# Patient Record
Sex: Male | Born: 2000 | Race: White | Hispanic: No | Marital: Single | Smoking: Never smoker
Health system: Southern US, Community
[De-identification: ages and names within clinical notes are randomized; demographics above are authoritative.]

## PROBLEM LIST (undated history)

## (undated) DIAGNOSIS — F419 Anxiety disorder, unspecified: Secondary | ICD-10-CM

## (undated) DIAGNOSIS — F329 Major depressive disorder, single episode, unspecified: Secondary | ICD-10-CM

## (undated) DIAGNOSIS — F32A Depression, unspecified: Secondary | ICD-10-CM

---
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
## 2021-05-05 ENCOUNTER — Emergency Department: Admit: 2021-05-05 | Payer: BLUE CROSS/BLUE SHIELD

## 2021-05-05 ENCOUNTER — Inpatient Hospital Stay
Admit: 2021-05-05 | Discharge: 2021-05-05 | Disposition: A | Discharge status: Home / Self Care | Payer: MEDICAID | Attending: Emergency Medicine

## 2021-05-05 DIAGNOSIS — H5462 Unqualified visual loss, left eye, normal vision right eye: Secondary | ICD-10-CM

## 2021-05-05 LAB — CBC WITH AUTO DIFFERENTIAL
Absolute Immature Granulocyte: 0 10*3/uL (ref 0.0–0.5)
Basophils %: 0 % (ref 0.0–2.0)
Basophils Absolute: 0 10*3/uL (ref 0.0–0.2)
Eosinophils %: 1 % (ref 0.5–7.8)
Eosinophils Absolute: 0 10*3/uL (ref 0.0–0.8)
Hematocrit: 46.4 % (ref 41.1–50.3)
Hemoglobin: 15.8 g/dL (ref 13.6–17.2)
Immature Granulocytes: 0 % (ref 0.0–5.0)
Lymphocytes %: 29 % (ref 13–44)
Lymphocytes Absolute: 1.7 10*3/uL (ref 0.5–4.6)
MCH: 29.2 PG (ref 26.1–32.9)
MCHC: 34.1 g/dL (ref 31.4–35.0)
MCV: 85.6 FL (ref 82–102)
MPV: 10.6 FL (ref 9.4–12.3)
Monocytes %: 7 % (ref 4.0–12.0)
Monocytes Absolute: 0.4 10*3/uL (ref 0.1–1.3)
Neutrophils %: 63 % (ref 43–78)
Neutrophils Absolute: 3.7 10*3/uL (ref 1.7–8.2)
Platelets: 242 10*3/uL (ref 150–450)
RBC: 5.42 M/uL (ref 4.23–5.6)
RDW: 13.6 % (ref 11.9–14.6)
WBC: 5.9 10*3/uL (ref 4.3–11.1)
nRBC: 0 10*3/uL (ref 0.0–0.2)

## 2021-05-05 LAB — COMPREHENSIVE METABOLIC PANEL
ALT: 54 U/L (ref 12–65)
AST: 24 U/L (ref 15–37)
Albumin: 4.5 g/dL (ref 3.5–5.0)
Alk Phosphatase: 51 U/L (ref 50–136)
Anion Gap: 6 mmol/L (ref 2–11)
BUN: 11 MG/DL (ref 6–23)
CO2: 27 mmol/L (ref 21–32)
Calcium: 9.8 MG/DL (ref 8.3–10.4)
Chloride: 108 mmol/L (ref 101–110)
Creatinine: 0.9 MG/DL (ref 0.8–1.5)
Est, Glom Filt Rate: >60 mL/min/{1.73_m2} (ref >60)
Glucose: 94 mg/dL (ref 65–100)
Potassium: 3.1 mmol/L — ABNORMAL LOW (ref 3.5–5.1)
Sodium: 141 mmol/L (ref 133–143)
Total Bilirubin: 0.9 MG/DL (ref 0.2–1.1)
Total Protein: 8.4 g/dL — ABNORMAL HIGH (ref 6.3–8.2)

## 2021-05-05 NOTE — ED Triage Notes (Signed)
Pt ambulatory to triage with fiance with CO "I think I had a seizure this morning, I was watching tiktok and just blanked out and feel really drained and shaky and I cant see out of ly left eye" reports similar vision changes with seizures in past. Not currently taking medications. Last seizure approximately 5 months ago.

## 2021-05-05 NOTE — ED Provider Notes (Signed)
Emergency Department Provider Note                   PCP:                None None               Age: 21 y.o.      Sex: male     DISPOSITION Decision To Discharge 05/05/2021 11:21:57 AM       ICD-10-CM    1. Vision loss of left eye  H54.62       2. Seizure (Cash)  R56.9           MEDICAL DECISION MAKING  Complexity of Problems Addressed:  1 or more acute illnesses that pose a threat to life or bodily function.     Data Reviewed and Analyzed:  Category 1:   I reviewed records from an external source: provider visit notes from outside specialist.  I ordered each unique test.  I reviewed the results of each unique test.        Category 2:       I independently ordered and reviewed the CT Scan.  No acute    Category 3: Discussion of management or test interpretation.  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  Had questionable seizure-like activity this morning and has blurry vision since then.  Denies any pain.  No headache.  With vision decrease contacted [facility] eye and they request patient follow-up in the office immediately after discharge from here.  Will have patient follow-up with his neurologist for possible recurrent seizures.  Will discharge.         Risk of Complications and/or Morbidity of Patient Management:  Patient was discharged risks and benefits of hospitalization were considered and Discussion with external consultants     Is this patient to be included in the SEP-1 core measure due to severe sepsis or septic shock? No Exclusion criteria - the patient is NOT to be included for SEP-1 Core Measure due to: Infection is not suspected     Carl Carter is a 21 y.o. 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  Has had 2 previous episodes of blurry vision in that and sees a retina specialist at [facility] eye.  Dr. Michail Jewels.  Also has a  history of seizures and is followed by neurologist in [place].  Was on Keppra but had some side effects per him so stopped.  Last seizure was 5 months ago.  He was lying in bed this morning looking at his phone.  The next thing he knows his phone is on the ground and he has some blurry vision in his left eye.  Afterwards was shaking due to anxiety about possibly having a seizure.  Comes in for evaluation.  Left eye is still blurry.  Denies any eye pain or headache.  No chest pain or shortness of breath.    The history is provided by the patient. No language interpreter was used.   Seizures  Seizure activity on arrival: no    Seizure type:  Unable to specify  Episode characteristics: disorientation    Episode characteristics: no incontinence and no tongue biting    Return to baseline: yes    Severity:  Mild  Timing:  Once  Progression:  Resolved  Context: medical non-compliance    Recent head injury:  No recent head injuries     Review of Systems   Constitutional:  Negative for chills and fever.   HENT:  Negative for rhinorrhea and sore throat.    Eyes:  Positive for visual disturbance. Negative for pain and redness.   Respiratory:  Negative for cough and shortness of breath.    Cardiovascular:  Negative for chest pain and palpitations.   Gastrointestinal:  Negative for abdominal pain, constipation, diarrhea, nausea and vomiting.   Genitourinary:  Negative for dysuria and hematuria.   Musculoskeletal:  Negative for back pain and neck pain.   Skin:  Negative for color change and rash.   Neurological:  Positive for seizures. Negative for facial asymmetry, speech difficulty, weakness, light-headedness, numbness and headaches.   All other systems reviewed and are negative.    Vitals signs and nursing note reviewed:  Patient Vitals for the past 4 hrs:   Temp Pulse Resp BP SpO2   05/05/21 1015 -- 77 -- 129/79 98 %   05/05/21 0917 98.1 ??F (36.7 ??C) (!) 102 20 (!) 148/85 100 %          Physical Exam  Vitals and nursing note  reviewed.   Constitutional:       Appearance: Normal appearance.   HENT:      Head: Normocephalic and atraumatic.   Eyes:      General: Lids are normal.      Extraocular Movements: Extraocular movements intact.      Conjunctiva/sclera: Conjunctivae normal.      Pupils: Pupils are equal, round, and reactive to light.      Left eye: Pupil is not sluggish.      Comments: Right eye 20/20  Left eye can't see the letters on the chart. Vision is not dark just blurry.    Cardiovascular:      Rate and Rhythm: Normal rate and regular rhythm.   Pulmonary:      Effort: Pulmonary effort is normal.      Breath sounds: Normal breath sounds. No wheezing.   Abdominal:      General: Bowel sounds are normal.      Palpations: Abdomen is soft.      Tenderness: There is no abdominal tenderness.   Musculoskeletal:         General: No swelling. Normal range of motion.      Cervical back: Normal range of motion. No tenderness.   Skin:     General: Skin is warm and dry.   Neurological:      General: No focal deficit present.      Mental Status: He is alert and oriented to person, place, and time.      Cranial Nerves: No cranial nerve deficit.      Motor: No weakness.        Procedures          Orders Placed This Encounter   Procedures    CT HEAD WO CONTRAST    CBC with Auto Differential    Comprehensive Metabolic Panel    Visual acuity screening        Medications - No data to display    New Prescriptions    No medications on file        History reviewed. No pertinent past medical history.     History reviewed. No pertinent surgical history.     History reviewed. No pertinent family history.  Social History     Socioeconomic History    Marital status: Single     Spouse name: None    Number of children: None    Years of education: None    Highest education level: None        Allergies: Patient has no known allergies.    Previous Medications    No medications on file        Results for orders placed or performed during the hospital  encounter of 05/05/21   CT HEAD WO CONTRAST    Narrative    CT BRAIN WITHOUT CONTRAST  05/05/2021 10:40 AM    INDICATION: Headache, seizure history.    COMPARISON: None available at this hospital PACS system    TECHNIQUE:  Multiple contiguous axial images are obtained encompassing the brain  from the skull base to the vertex.  For this CT scanner at least one of the  following techniques is utilized to decrease patient radiation exposure:  Automatic exposure control, modulation of MA and KVP based on patient weight,  and iterative reconstruction.    FINDINGS: Ventricles are midline and of appropriate size and configuration for  age.   The midline and parasellar structures, and posterior fossa are intact.    There is no finding of an acute cortical stroke, mass lesion, or acute  intracranial blood.    No extra-axial fluid collection.  No other focal finding.    The skull is intact, no significant abnormality.  Paranasal sinuses clear.   Orbits, paranasal sinuses not remarkable.      Impression    NO ACUTE INTRACRANIAL ABNORMALITY.       CBC with Auto Differential   Result Value Ref Range    WBC 5.9 4.3 - 11.1 K/uL    RBC 5.42 4.23 - 5.6 M/uL    Hemoglobin 15.8 13.6 - 17.2 g/dL    Hematocrit 46.4 41.1 - 50.3 %    MCV 85.6 82 - 102 FL    MCH 29.2 26.1 - 32.9 PG    MCHC 34.1 31.4 - 35.0 g/dL    RDW 13.6 11.9 - 14.6 %    Platelets 242 150 - 450 K/uL    MPV 10.6 9.4 - 12.3 FL    nRBC 0.00 0.0 - 0.2 K/uL    Differential Type AUTOMATED      Seg Neutrophils 63 43 - 78 %    Lymphocytes 29 13 - 44 %    Monocytes 7 4.0 - 12.0 %    Eosinophils % 1 0.5 - 7.8 %    Basophils 0 0.0 - 2.0 %    Immature Granulocytes 0 0.0 - 5.0 %    Segs Absolute 3.7 1.7 - 8.2 K/UL    Absolute Lymph # 1.7 0.5 - 4.6 K/UL    Absolute Mono # 0.4 0.1 - 1.3 K/UL    Absolute Eos # 0.0 0.0 - 0.8 K/UL    Basophils Absolute 0.0 0.0 - 0.2 K/UL    Absolute Immature Granulocyte 0.0 0.0 - 0.5 K/UL   Comprehensive Metabolic Panel   Result Value Ref Range    Sodium  141 133 - 143 mmol/L    Potassium 3.1 (L) 3.5 - 5.1 mmol/L    Chloride 108 101 - 110 mmol/L    CO2 27 21 - 32 mmol/L    Anion Gap 6 2 - 11 mmol/L    Glucose 94 65 - 100 mg/dL    BUN 11 6 - 23 MG/DL  Creatinine PENDING MG/DL    Est, Glom Filt Rate PENDING ml/min/1.70m    Calcium 9.8 8.3 - 10.4 MG/DL    Total Bilirubin PENDING MG/DL    ALT PENDING U/L    AST PENDING U/L    Alk Phosphatase PENDING U/L    Total Protein PENDING g/dL    Albumin 4.5 3.5 - 5.0 g/dL    Globulin Cannot be calculated 2.8 - 4.5 g/dL    Albumin/Globulin Ratio PENDING          CT HEAD WO CONTRAST   Final Result   NO ACUTE INTRACRANIAL ABNORMALITY.                              Voice dictation software was used during the making of this note.  This software is not perfect and grammatical and other typographical errors may be present.  This note has not been completely proofread for errors.     JTowanda OctaveIII, MD  05/05/21 [phone]

## 2021-05-05 NOTE — ED Notes (Signed)
I have reviewed discharge instructions with the patient.  The patient verbalized understanding.    Patient left ED via Discharge Method: ambulatory to Home with (Fiance).    Opportunity for questions and clarification provided.       Patient given 0 scripts.         To continue your aftercare when you leave the hospital, you may receive an automated call from our care team to check in on how you are doing.  This is a free service and part of our promise to provide the best care and service to meet your aftercare needs.??? If you have questions, or wish to unsubscribe from this service please call [phone].  Thank you for Choosing our [facility] Emergency Department.      Gevena Barre, RN  05/05/21 1146

## 2022-06-15 ENCOUNTER — Inpatient Hospital Stay
Admit: 2022-06-15 | Discharge: 2022-06-16 | Disposition: A | Discharge status: Home / Self Care | Attending: Emergency Medicine

## 2022-06-15 ENCOUNTER — Emergency Department: Admit: 2022-06-15

## 2022-06-15 DIAGNOSIS — R0789 Other chest pain: Secondary | ICD-10-CM

## 2022-06-15 DIAGNOSIS — R079 Chest pain, unspecified: Secondary | ICD-10-CM

## 2022-06-15 LAB — CBC WITH AUTO DIFFERENTIAL
Basophils %: 0 % (ref 0.0–2.0)
Basophils Absolute: 0 10*3/uL (ref 0.0–0.2)
Eosinophils %: 0 % — ABNORMAL LOW (ref 0.5–7.8)
Eosinophils Absolute: 0 10*3/uL (ref 0.0–0.8)
Hematocrit: 44.8 % (ref 41.1–50.3)
Hemoglobin: 15.8 g/dL (ref 13.6–17.2)
Immature Granulocytes %: 0 % (ref 0.0–5.0)
Immature Granulocytes Absolute: 0 10*3/uL (ref 0.0–0.5)
Lymphocytes %: 16 % (ref 13–44)
Lymphocytes Absolute: 1.9 10*3/uL (ref 0.5–4.6)
MCH: 29.8 PG (ref 26.1–32.9)
MCHC: 35.3 g/dL — ABNORMAL HIGH (ref 31.4–35.0)
MCV: 84.5 FL (ref 82–102)
MPV: 10.6 FL (ref 9.4–12.3)
Monocytes %: 4 % (ref 4.0–12.0)
Monocytes Absolute: 0.5 10*3/uL (ref 0.1–1.3)
Neutrophils %: 80 % — ABNORMAL HIGH (ref 43–78)
Neutrophils Absolute: 9.5 10*3/uL — ABNORMAL HIGH (ref 1.7–8.2)
Platelets: 262 10*3/uL (ref 150–450)
RBC: 5.3 M/uL (ref 4.23–5.6)
RDW: 12.3 % (ref 11.9–14.6)
WBC: 12 10*3/uL — ABNORMAL HIGH (ref 4.3–11.1)
nRBC: 0 10*3/uL (ref 0.0–0.2)

## 2022-06-15 LAB — POCT GLUCOSE: POC Glucose: 111 mg/dL — ABNORMAL HIGH (ref 65–100)

## 2022-06-15 LAB — COMPREHENSIVE METABOLIC PANEL
ALT: 31 U/L (ref 12–65)
AST: 24 U/L (ref 15–37)
Albumin/Globulin Ratio: 1.7 (ref 1.0–1.9)
Albumin: 5 g/dL (ref 3.5–5.0)
Alk Phosphatase: 58 U/L (ref 40–129)
Anion Gap: 15 mmol/L (ref 9–18)
BUN: 11 MG/DL (ref 6–23)
CO2: 23 mmol/L (ref 20–28)
Calcium: 10.2 MG/DL (ref 8.8–10.2)
Chloride: 105 mmol/L (ref 98–107)
Creatinine: 0.91 MG/DL (ref 0.80–1.30)
Est, Glom Filt Rate: >90 mL/min/{1.73_m2} (ref >60)
Globulin: 2.9 g/dL (ref 2.3–3.5)
Glucose: 104 mg/dL — ABNORMAL HIGH (ref 70–99)
Potassium: 2.9 mmol/L — ABNORMAL LOW (ref 3.5–5.1)
Sodium: 142 mmol/L (ref 136–145)
Total Bilirubin: 0.8 MG/DL (ref 0.0–1.2)
Total Protein: 7.9 g/dL (ref 6.3–8.2)

## 2022-06-15 LAB — D-DIMER, QUANTITATIVE: D-Dimer, Quant: 0.31 ug/ml(FEU) (ref <0.56)

## 2022-06-15 LAB — TROPONIN: Troponin T: 7 ng/L (ref 0–22)

## 2022-06-15 MED ORDER — SODIUM CHLORIDE (PF) 0.9 % IJ SOLN
0.9 | Freq: Once | INTRAMUSCULAR | Status: AC
Start: 2022-06-15 — End: 2022-06-15
  Administered 2022-06-15: 23:00:00 1 mg via INTRAVENOUS

## 2022-06-15 MED FILL — LORAZEPAM 2 MG/ML IJ SOLN: 2 MG/ML | INTRAMUSCULAR | Qty: 1

## 2022-06-15 NOTE — ED Notes (Signed)
Patient mobility status  with no difficulty. Provider aware     I have reviewed discharge instructions with the patient.  The patient verbalized understanding.    Patient left ED via Discharge Method: ambulatory to Home with Spouse.    Opportunity for questions and clarification provided.     Patient given 0 scripts.           Betsey Holiday, RN  06/15/22 2148

## 2022-06-15 NOTE — ED Notes (Signed)
Another RN unable to obtain line and labs. Another nurse to obtain ultrasound guided IV at this time. Unable to give IV ativan due to no IV     Betsey Holiday, RN  06/15/22 1822

## 2022-06-15 NOTE — ED Triage Notes (Signed)
Pt [place] via EMS from restaurant. Pt had sudden onset of CP. CP radiates to lt arm, nausea and dizziness. Hx htn. 324 ASA nad 3 nitro given en route. BP 180/110. HR 80s. Family hx MI.

## 2022-06-15 NOTE — ED Provider Notes (Signed)
Emergency Department Provider Note       PCP: None, None (Inactive)   Age: 22 y.o.   Sex: male     DISPOSITION Decision To Discharge 06/15/2022 09:36:17 PM       ICD-10-CM    1. Chest pain, unspecified type  R07.9           Medical Decision Making     Patient seems anxious so he was given Ativan.  I suspect this is the cause of his chest pain.  His workup is unremarkable including a negative D-dimer.  Discussed the findings with him.  Encouraged him to follow-up with his neurologist to reinitiate seizure medication and to avoid driving until he is back on his medication.     1 or more acute illnesses that pose a threat to life or bodily function.   Prescription drug management performed.  Parental controlled substances given in the ED.    I independently ordered and reviewed each unique test.  I reviewed external records: provider visit note from outside specialist. neuro  The patients assessment required an independent historian: S.O..  The reason they were needed is important historical information not provided by the patient.  I interpreted the X-rays no infiltrate.  My Independent EKG Interpretation: sinus rhythm, no evidence of arrhythmia      ST Segments:Normal ST segments - NO STEMI   Rate: 99            History     Presents with chest pain.  He reports stabbing left-sided chest pain for approximately an hour.  He reports nausea and the sensation of spinning and shortness of breath.  He states this occurred while he was on his way to work.  He has never had this before.  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  He has not followed up with his primary care doctor to adjust his medications.  He uses tobacco dip.  He denies alcohol abuse.  He denies being anxious but appears very anxious.  Nursing reported that he had a brief episode in which she would not respond to them.  Patient was awake and alert when I came into the  room after they came to get me for this episode.  Significant other than patient reported likely one of his absence seizure's    The history is provided by the patient.     Physical Exam     Vitals signs and nursing note reviewed:  Vitals:    06/15/22 2020 06/15/22 2030 06/15/22 2045 06/15/22 2115   BP:  128/85 128/87 95/82   Pulse: 93 78 84 96   Resp: 18 16 17 17    Temp:       TempSrc:       SpO2: 100% 100% 100% 99%   Weight:       Height:          Physical Exam  Vitals and nursing note reviewed.   Constitutional:       General: He is not in acute distress.     Appearance: Normal appearance. He is well-developed. He is not ill-appearing.   HENT:      Head: Normocephalic and atraumatic.   Eyes:      Conjunctiva/sclera: Conjunctivae normal.   Cardiovascular:      Rate and Rhythm: Normal rate and regular rhythm.   Pulmonary:      Effort: Pulmonary effort is normal.  Breath sounds: Normal breath sounds.   Abdominal:      Palpations: Abdomen is soft.      Tenderness: There is no abdominal tenderness. There is no guarding or rebound.   Musculoskeletal:         General: Normal range of motion.      Cervical back: Normal range of motion and neck supple.      Right lower leg: No edema.      Left lower leg: No edema.   Skin:     General: Skin is warm and dry.      Coloration: Skin is not jaundiced.   Neurological:      General: No focal deficit present.      Mental Status: He is alert and oriented to person, place, and time.      Cranial Nerves: No cranial nerve deficit.   Psychiatric:         Mood and Affect: Mood normal.         Behavior: Behavior normal.        Procedures     Procedures    Orders Placed This Encounter   Procedures    XR CHEST PORTABLE    CBC with Auto Differential    D-Dimer, Quantitative    Comprehensive Metabolic Panel    Troponin Now and Q 90 Min    Troponin    POCT Glucose    EKG 12 Lead        Medications given during this emergency department visit:  Medications   LORazepam (ATIVAN) 1 mg in  sodium chloride (PF) 0.9 % 10 mL injection (1 mg IntraVENous Given 06/15/22 1831)   potassium bicarb-citric acid (EFFER-K) effervescent tablet 40 mEq (40 mEq Oral Given 06/15/22 2017)       There are no discharge medications for this patient.       No past medical history on file.     No past surgical history on file.     Social History     Socioeconomic History    Marital status: Single   Tobacco Use    Smoking status: Never    Smokeless tobacco: Current     Types: Chew   Substance and Sexual Activity    Drug use: Yes     Types: Marijuana (Weed)     Comment: "every once in awhile"        There are no discharge medications for this patient.       Results for orders placed or performed during the hospital encounter of 06/15/22   XR CHEST PORTABLE    Narrative    Chest X-ray    INDICATION: Chest pain    COMPARISON:  None    TECHNIQUE: AP/PA view of the chest was obtained.    FINDINGS: The lungs are clear. There are no infiltrates or effusions.  The heart  size is normal.  The bony thorax is intact.        Impression    No acute findings in the chest     CBC with Auto Differential   Result Value Ref Range    WBC 12.0 (H) 4.3 - 11.1 K/uL    RBC 5.30 4.23 - 5.6 M/uL    Hemoglobin 15.8 13.6 - 17.2 g/dL    Hematocrit 45.4 09.8 - 50.3 %    MCV 84.5 82 - 102 FL    MCH 29.8 26.1 - 32.9 PG    MCHC 35.3 (H) 31.4 - 35.0 g/dL  RDW 12.3 11.9 - 14.6 %    Platelets 262 150 - 450 K/uL    MPV 10.6 9.4 - 12.3 FL    nRBC 0.00 0.0 - 0.2 K/uL    Differential Type AUTOMATED      Neutrophils % 80 (H) 43 - 78 %    Lymphocytes % 16 13 - 44 %    Monocytes % 4 4.0 - 12.0 %    Eosinophils % 0 (L) 0.5 - 7.8 %    Basophils % 0 0.0 - 2.0 %    Immature Granulocytes % 0 0.0 - 5.0 %    Neutrophils Absolute 9.5 (H) 1.7 - 8.2 K/UL    Lymphocytes Absolute 1.9 0.5 - 4.6 K/UL    Monocytes Absolute 0.5 0.1 - 1.3 K/UL    Eosinophils Absolute 0.0 0.0 - 0.8 K/UL    Basophils Absolute 0.0 0.0 - 0.2 K/UL    Immature Granulocytes Absolute 0.0 0.0 - 0.5 K/UL    D-Dimer, Quantitative   Result Value Ref Range    D-Dimer, Quant 0.31 <0.56 ug/ml(FEU)   Comprehensive Metabolic Panel   Result Value Ref Range    Sodium 142 136 - 145 mmol/L    Potassium 2.9 (L) 3.5 - 5.1 mmol/L    Chloride 105 98 - 107 mmol/L    CO2 23 20 - 28 mmol/L    Anion Gap 15 9 - 18 mmol/L    Glucose 104 (H) 70 - 99 mg/dL    BUN 11 6 - 23 MG/DL    Creatinine 1.61 0.96 - 1.30 MG/DL    Est, Glom Filt Rate >90 >60 ml/min/1.16m2    Calcium 10.2 8.8 - 10.2 MG/DL    Total Bilirubin 0.8 0.0 - 1.2 MG/DL    ALT 31 12 - 65 U/L    AST 24 15 - 37 U/L    Alk Phosphatase 58 40 - 129 U/L    Total Protein 7.9 6.3 - 8.2 g/dL    Albumin 5.0 3.5 - 5.0 g/dL    Globulin 2.9 2.3 - 3.5 g/dL    Albumin/Globulin Ratio 1.7 1.0 - 1.9     Troponin Now and Q 90 Min   Result Value Ref Range    Troponin T 7.0 0 - 22 ng/L   Troponin   Result Value Ref Range    Troponin T 7.0 0 - 22 ng/L   POCT Glucose   Result Value Ref Range    POC Glucose 111 (H) 65 - 100 mg/dL    Performed by: ScalfHelanahBSN    EKG 12 Lead   Result Value Ref Range    Ventricular Rate 89 BPM    Atrial Rate 84 BPM    P-R Interval 139 ms    QRS Duration 96 ms    Q-T Interval 382 ms    QTc Calculation (Bazett) 465 ms    P Axis 77 degrees    R Axis 73 degrees    T Axis 20 degrees    Diagnosis       Sinus arrhythmia    Confirmed by MATHIAS, MD (UC), SHAWN ([id number]) on 06/16/2022 12:11:22 AM           XR CHEST PORTABLE   Final Result   No acute findings in the chest                      No results for input(s): "COVID19" in the last 72 hours.     Voice  dictation software was used during the making of this note.  This software is not perfect and grammatical and other typographical errors may be present.  This note has not been completely proofread for errors.      Anne Hahn, MD  06/16/22 [phone]

## 2022-06-15 NOTE — ED Notes (Addendum)
Unable to obtain IV and blood work. Another RN will try at this time     Betsey Holiday, RN  06/15/22 1821       Betsey Holiday, RN  06/15/22 1821

## 2022-06-16 ENCOUNTER — Emergency Department: Admit: 2022-06-16

## 2022-06-16 ENCOUNTER — Inpatient Hospital Stay
Admit: 2022-06-16 | Discharge: 2022-06-16 | Disposition: A | Discharge status: Home / Self Care | Attending: Emergency Medicine

## 2022-06-16 DIAGNOSIS — R0781 Pleurodynia: Secondary | ICD-10-CM

## 2022-06-16 LAB — COMPREHENSIVE METABOLIC PANEL W/ REFLEX TO MG FOR LOW K
ALT: 26 U/L (ref 12–65)
AST: 25 U/L (ref 15–37)
Albumin/Globulin Ratio: 1.7 (ref 1.0–1.9)
Albumin: 4.8 g/dL (ref 3.5–5.0)
Alk Phosphatase: 56 U/L (ref 40–129)
Anion Gap: 16 mmol/L (ref 9–18)
BUN: 12 MG/DL (ref 6–23)
CO2: 21 mmol/L (ref 20–28)
Calcium: 10 MG/DL (ref 8.8–10.2)
Chloride: 105 mmol/L (ref 98–107)
Creatinine: 0.95 MG/DL (ref 0.80–1.30)
Est, Glom Filt Rate: >90 mL/min/{1.73_m2} (ref >60)
Globulin: 2.9 g/dL (ref 2.3–3.5)
Glucose: 116 mg/dL — ABNORMAL HIGH (ref 70–99)
Potassium: 3.9 mmol/L (ref 3.5–5.1)
Sodium: 142 mmol/L (ref 136–145)
Total Bilirubin: 0.7 MG/DL (ref 0.0–1.2)
Total Protein: 7.7 g/dL (ref 6.3–8.2)

## 2022-06-16 LAB — EKG 12-LEAD
Atrial Rate: 84 {beats}/min
Atrial Rate: 99 {beats}/min
P Axis: 77 degrees
P Axis: 82 degrees
P-R Interval: 139 ms
P-R Interval: 139 ms
Q-T Interval: 382 ms
Q-T Interval: 349 ms
QRS Duration: 96 ms
QRS Duration: 94 ms
QTc Calculation (Bazett): 465 ms
QTc Calculation (Bazett): 453 ms
R Axis: 73 degrees
R Axis: 76 degrees
T Axis: 20 degrees
T Axis: 22 degrees
Ventricular Rate: 89 {beats}/min
Ventricular Rate: 101 {beats}/min

## 2022-06-16 LAB — CBC WITH AUTO DIFFERENTIAL
Basophils %: 0 % (ref 0.0–2.0)
Basophils Absolute: 0 10*3/uL (ref 0.0–0.2)
Eosinophils %: 0 % — ABNORMAL LOW (ref 0.5–7.8)
Eosinophils Absolute: 0 10*3/uL (ref 0.0–0.8)
Hematocrit: 45.6 % (ref 41.1–50.3)
Hemoglobin: 15.7 g/dL (ref 13.6–17.2)
Immature Granulocytes %: 0 % (ref 0.0–5.0)
Immature Granulocytes Absolute: 0 10*3/uL (ref 0.0–0.5)
Lymphocytes %: 33 % (ref 13–44)
Lymphocytes Absolute: 2 10*3/uL (ref 0.5–4.6)
MCH: 28.8 PG (ref 26.1–32.9)
MCHC: 34.4 g/dL (ref 31.4–35.0)
MCV: 83.5 FL (ref 82–102)
MPV: 10.6 FL (ref 9.4–12.3)
Monocytes %: 7 % (ref 4.0–12.0)
Monocytes Absolute: 0.4 10*3/uL (ref 0.1–1.3)
Neutrophils %: 60 % (ref 43–78)
Neutrophils Absolute: 3.6 10*3/uL (ref 1.7–8.2)
Platelets: 281 10*3/uL (ref 150–450)
RBC: 5.46 M/uL (ref 4.23–5.6)
RDW: 12.7 % (ref 11.9–14.6)
WBC: 6 10*3/uL (ref 4.3–11.1)
nRBC: 0 10*3/uL (ref 0.0–0.2)

## 2022-06-16 LAB — TROPONIN
Troponin T: 7 ng/L (ref 0–22)
Troponin T: 7 ng/L (ref 0–22)

## 2022-06-16 MED ORDER — POTASSIUM BICARB-CITRIC ACID 20 MEQ PO TBEF
20 | Freq: Once | ORAL | Status: AC
Start: 2022-06-16 — End: 2022-06-15
  Administered 2022-06-16: 40 meq via ORAL

## 2022-06-16 MED ORDER — METHYLPREDNISOLONE SODIUM SUCC 125 MG IJ SOLR
125 | Freq: Once | INTRAMUSCULAR | Status: AC
Start: 2022-06-16 — End: 2022-06-16
  Administered 2022-06-16: 16:00:00 125 mg via INTRAVENOUS

## 2022-06-16 MED ORDER — METHYLPREDNISOLONE 4 MG PO TBPK
4 | PACK | ORAL | 0 refills | Status: AC
Start: 2022-06-16 — End: ?

## 2022-06-16 MED FILL — METHYLPREDNISOLONE SODIUM SUCC 125 MG IJ SOLR: 125 MG | INTRAMUSCULAR | Qty: 125

## 2022-06-16 MED FILL — EFFER-K 20 MEQ PO TBEF: 20 MEQ | ORAL | Qty: 2

## 2022-06-16 NOTE — ED Notes (Signed)
Patient mobility status  with no difficulty. Provider aware     I have reviewed discharge instructions with the patient.  The patient verbalized understanding.    Patient left ED via Discharge Method: ambulatory to Home with Spouse.    Opportunity for questions and clarification provided.     Patient given 1 scripts.            Hayden Pedro, RN  06/16/22 1419

## 2022-06-16 NOTE — ED Triage Notes (Signed)
Pt to ED ambulatory from [facility] stretcher for chest pain, shortness of breath, and nausea that began yesterday, which he was seen at this facility for and dc, states he was feeling better at time of discharge. States this morning sudden onset of sharp stabbing left sided chest pain non-radiating while laying in bed. States palpitations as well. States hx of anxiety but states this is not like his normal anxiety attacks. Pt endorses shortness of breath as well. Slight improvement with the two nitro. Pt states extensive family cardiac hx and that is why he is coming back to be evaluated.        324 ASA  0.8 NTG SL  4 mg IV zofran  100 mL NS IV  20G RA     120-140 sinus tach with EMS  100% on RA but states shortness of breath   150/100 BP then 130/90 after first NTG

## 2022-06-16 NOTE — Discharge Instructions (Signed)
Take the full course of steroids as prescribed.  Stop smoking  You need to establish care with a primary doctor to follow-up with soon as possible.  Please call the number below.  If you develop any new or concerning symptoms return to the ER at that time    We would love to help you get a primary care doctor for follow-up after your emergency department visit.    Please call [phone] between 7AM - 6PM Monday to Friday.  A care navigator will be able to assist you with setting up a doctor close to your home.

## 2022-06-16 NOTE — ED Provider Notes (Signed)
Emergency Department Provider Note       PCP: None, None (Inactive)   Age: 22 y.o.   Sex: male     DISPOSITION Decision To Discharge 06/16/2022 02:08:02 PM       ICD-10-CM    1. Pleuritic chest pain  R07.81           Medical Decision Making     DDX:     CHF, COPD, pneumonia, PE,    MI, coronary artery disease, unstable angina, coronary artery disease,    Atrial fibrillation, cardiac arrhythmia, PVC, medication induced palpitations, heart block,  electrolyte induced palpitations,    Aortic dissection, aortic aneurysm,    GERD, musculoskeletal pain, costochondritis, rib fracture, pleurisy,    ED Course as of 06/16/22 1411   Fri Jun 16, 2022   1407 I talked to the patient about the findings in the emergency department.  His troponin today is continuing to be negative.  I talked him about the need to stop smoking marijuana as this can cause a pleuritic pain mimicking the symptoms he is having.  Patient will be discharged home with a prescription for steroids to be taken for pain control [KH]      ED Course User Index  [KH] Katrina Stack D, DO     1 or more chronic illnesses with a severe exacerbation or progression.  Prescription drug management performed.  Shared medical decision making was utilized in creating the patients health plan today.    I independently ordered and reviewed each unique test.  I reviewed external records: ED visit note from an outside group.  I reviewed external records: previous EKG including cardiologist interpretation.    I reviewed external records: previous lab results from outside ED.  I reviewed external records: previous imaging study including radiologist interpretation.     I interpreted the X-rays no pneumothorax.  My Independent EKG Interpretation: sinus rhythm, no evidence of arrhythmia      ST Segments:Normal ST segments - NO STEMI   Rate: 72            History     22 year old male presenting to the emerged from today complaining of chest pain in the left chest and radiating down  the left arm.  He was seen evaluated here yesterday for the same thing.  He said he seemed to have his symptoms resolved at the time he went home but when he got up this morning his symptoms returned.  The patient states that he does not have any history of heart disease but he has a strong family history of coronary artery disease.  The patient smokes marijuana using a bong daily, he dips tobacco and denies any alcohol or other recreational drug use.          ROS     Review of Systems   Cardiovascular:  Positive for chest pain.        Physical Exam     Vitals signs and nursing note reviewed:  Vitals:    06/16/22 1127 06/16/22 1227   BP: (!) 144/119 122/74   Pulse: (!) 117 64   Resp: 20 13   Temp: 98 F (36.7 C)    TempSrc: Oral    SpO2: 100% 97%   Weight: 108.9 kg (240 lb)    Height: 1.956 m (6\' 5" )       Physical Exam     GENERAL:The patient has Body mass index is 28.46 kg/m. Well-hydrated.    VITAL SIGNS: Heart rate,  blood pressure, respiratory rate reviewed as recorded in  nurse's notes  EYES: Pupils reactive. Extraocular motion intact. No conjunctival redness or drainage.  MOUTH/THROAT: Pharynx clear; airway patent.  NECK: Supple, no meningeal signs. Trachea midline. No masses or thyromegaly.  LUNGS: Breath sounds clear and equal bilaterally no accessory muscle use.  CHEST: No deformity or crepitus appreciated.  No reproducible tenderness noted.  CARDIOVASCULAR: Regular rate and rhythm  ABDOMEN: Soft without tenderness. No palpable masses or organomegaly. No  peritoneal signs. No rigidity.  EXTREMITIES: No clubbing or cyanosis. No joint swelling. Normal muscle tone. No  restricted range of motion appreciated.  NEUROLOGIC: Sensation is grossly intact. Cranial nerve exam reveals face is  symmetrical, tongue is midline speech is clear.  SKIN: No rash or petechiae. Good skin turgor palpated.  PSYCHIATRIC: Alert and oriented. Appropriate behavior and judgment.    Procedures     Procedures    Orders Placed This  Encounter   Procedures    XR CHEST PORTABLE    CBC with Auto Differential    Comprehensive Metabolic Panel w/ Reflex to MG    Troponin    Cardiac Monitor - ED Only "IF" in treatment area.    EKG 12 Lead        Medications given during this emergency department visit:  Medications   methylPREDNISolone sodium succ (SOLU-MEDROL) 125 mg in sterile water 2 mL injection (125 mg IntraVENous Given 06/16/22 1229)       New Prescriptions    METHYLPREDNISOLONE (MEDROL DOSEPACK) 4 MG TABLET    Take by mouth.        History reviewed. No pertinent past medical history.     History reviewed. No pertinent surgical history.     Social History     Socioeconomic History    Marital status: Single     Spouse name: None    Number of children: None    Years of education: None    Highest education level: None   Tobacco Use    Smoking status: Never    Smokeless tobacco: Current     Types: Chew   Substance and Sexual Activity    Drug use: Yes     Types: Marijuana (Weed)     Comment: "every once in awhile"        Previous Medications    No medications on file        Results for orders placed or performed during the hospital encounter of 06/16/22   XR CHEST PORTABLE    Narrative    Chest X-ray    INDICATION: Chest pain    COMPARISON: June 15, 2022    TECHNIQUE: AP/PA view of the chest was obtained.    FINDINGS: The lungs are clear. There are no infiltrates or effusions.  The heart  size is normal.  The bony thorax is intact.        Impression    No acute findings in the chest     CBC with Auto Differential   Result Value Ref Range    WBC 6.0 4.3 - 11.1 K/uL    RBC 5.46 4.23 - 5.6 M/uL    Hemoglobin 15.7 13.6 - 17.2 g/dL    Hematocrit 81.1 91.4 - 50.3 %    MCV 83.5 82 - 102 FL    MCH 28.8 26.1 - 32.9 PG    MCHC 34.4 31.4 - 35.0 g/dL    RDW 78.2 95.6 - 21.3 %    Platelets 281 150 - 450  K/uL    MPV 10.6 9.4 - 12.3 FL    nRBC 0.00 0.0 - 0.2 K/uL    Differential Type AUTOMATED      Neutrophils % 60 43 - 78 %    Lymphocytes % 33 13 - 44 %     Monocytes % 7 4.0 - 12.0 %    Eosinophils % 0 (L) 0.5 - 7.8 %    Basophils % 0 0.0 - 2.0 %    Immature Granulocytes % 0 0.0 - 5.0 %    Neutrophils Absolute 3.6 1.7 - 8.2 K/UL    Lymphocytes Absolute 2.0 0.5 - 4.6 K/UL    Monocytes Absolute 0.4 0.1 - 1.3 K/UL    Eosinophils Absolute 0.0 0.0 - 0.8 K/UL    Basophils Absolute 0.0 0.0 - 0.2 K/UL    Immature Granulocytes Absolute 0.0 0.0 - 0.5 K/UL   Comprehensive Metabolic Panel w/ Reflex to MG   Result Value Ref Range    Sodium 142 136 - 145 mmol/L    Potassium 3.9 3.5 - 5.1 mmol/L    Chloride 105 98 - 107 mmol/L    CO2 21 20 - 28 mmol/L    Anion Gap 16 9 - 18 mmol/L    Glucose 116 (H) 70 - 99 mg/dL    BUN 12 6 - 23 MG/DL    Creatinine 1.61 0.96 - 1.30 MG/DL    Est, Glom Filt Rate >90 >60 ml/min/1.42m2    Calcium 10.0 8.8 - 10.2 MG/DL    Total Bilirubin 0.7 0.0 - 1.2 MG/DL    ALT 26 12 - 65 U/L    AST 25 15 - 37 U/L    Alk Phosphatase 56 40 - 129 U/L    Total Protein 7.7 6.3 - 8.2 g/dL    Albumin 4.8 3.5 - 5.0 g/dL    Globulin 2.9 2.3 - 3.5 g/dL    Albumin/Globulin Ratio 1.7 1.0 - 1.9     Troponin   Result Value Ref Range    Troponin T 7.0 0 - 22 ng/L   EKG 12 Lead   Result Value Ref Range    Ventricular Rate 101 BPM    Atrial Rate 99 BPM    P-R Interval 139 ms    QRS Duration 94 ms    Q-T Interval 349 ms    QTc Calculation (Bazett) 453 ms    P Axis 82 degrees    R Axis 76 degrees    T Axis 22 degrees    Diagnosis       Sinus tachycardia    Confirmed by Tawni Carnes MD, Greig Castilla [phone]) on 06/16/2022 12:32:18 PM           XR CHEST PORTABLE   Final Result   No acute findings in the chest                      No results for input(s): "COVID19" in the last 72 hours.    Voice dictation software was used during the making of this note.  This software is not perfect and grammatical and other typographical errors may be present.  This note has not been completely proofread for errors.        Natale Milch, DO  06/16/22 1411

## 2022-06-17 ENCOUNTER — Inpatient Hospital Stay
Admit: 2022-06-17 | Discharge: 2022-06-18 | Disposition: A | Discharge status: Home / Self Care | Attending: Emergency Medicine

## 2022-06-17 DIAGNOSIS — R091 Pleurisy: Secondary | ICD-10-CM

## 2022-06-17 MED ORDER — KETOROLAC TROMETHAMINE 15 MG/ML IJ SOLN
15 | INTRAMUSCULAR | Status: AC
Start: 2022-06-17 — End: 2022-06-17
  Administered 2022-06-17: 15 mg via INTRAMUSCULAR

## 2022-06-17 MED ORDER — DEXAMETHASONE SODIUM PHOSPHATE 10 MG/ML IJ SOLN
10 | INTRAMUSCULAR | Status: AC
Start: 2022-06-17 — End: 2022-06-17
  Administered 2022-06-17: 10 mg via INTRAMUSCULAR

## 2022-06-17 MED FILL — DEXAMETHASONE SODIUM PHOSPHATE 10 MG/ML IJ SOLN: 10 MG/ML | INTRAMUSCULAR | Qty: 1

## 2022-06-17 MED FILL — KETOROLAC TROMETHAMINE 15 MG/ML IJ SOLN: 15 MG/ML | INTRAMUSCULAR | Qty: 1

## 2022-06-17 NOTE — ED Triage Notes (Signed)
Pt arrives via [facility] from home with [facility] CP seen 4/25 and 4/26. Rxed prednisone

## 2022-06-17 NOTE — ED Provider Notes (Addendum)
Emergency Department Provider Note       PCP: None, None (Inactive)   Age: 22 y.o.   Sex: male     DISPOSITION Discharge - Pending Orders Complete 06/17/2022 07:29:39 PM       ICD-10-CM    1. Pleurisy  R09.1       2. Chest wall pain  R07.89           Medical Decision Making     EKGs 2 over the last few days have been nonischemic.  D-dimer was normal.  PERC criteria negative for PE today.  2 chest x-rays in the last few days have been normal.  I do not see any indication for repeat evaluation today but will medicate him with IM Toradol and steroid.  Will recommend over-the-counter nonsteroidals in addition to his prednisone for pain as well as Tylenol.  Referral to PCP will be placed     1 chronic illness with exacerbation.  Over the counter drug management performed.  Patient was discharged risks and benefits of hospitalization were considered.  Shared medical decision making was utilized in creating the patients health plan today.    I independently ordered and reviewed each unique test.  I reviewed external records: previous EKG including cardiologist interpretation.    I reviewed external records: Recent lab work and  XRAY                  History     Left-sided pleuritic chest pain for 3 days.  This is the patient's third ED visit in 3 days.  He has 6 days of prednisone prescribed for him and took initial dose today but his pain is not resolved so he presents for reevaluation          ROS     Review of Systems   Constitutional:  Negative for chills and fever.   HENT:  Negative for congestion and rhinorrhea.    Respiratory:  Positive for shortness of breath. Negative for cough.    Cardiovascular:  Positive for chest pain. Negative for leg swelling.   Gastrointestinal:  Negative for abdominal pain, diarrhea, nausea and vomiting.   Endocrine: Negative for polydipsia and polyuria.   Genitourinary:  Negative for dysuria, frequency and hematuria.   Musculoskeletal:  Negative for back pain and myalgias.   Skin:   Negative for color change and rash.   Neurological:  Negative for weakness and numbness.   All other systems reviewed and are negative.       Physical Exam     Vitals signs and nursing note reviewed:  Vitals:    06/17/22 1846   BP: 124/83   Pulse: 81   Resp: 16   Temp: 98.1 F (36.7 C)   TempSrc: Oral   SpO2: 100%   Weight: 108.9 kg (240 lb)   Height: 1.956 m (6\' 5" )      Physical Exam  Vitals and nursing note reviewed.   Constitutional:       Appearance: Normal appearance.   HENT:      Head: Normocephalic and atraumatic.      Nose: Nose normal.      Mouth/Throat:      Mouth: Mucous membranes are moist.   Eyes:      Conjunctiva/sclera: Conjunctivae normal.      Pupils: Pupils are equal, round, and reactive to light.   Cardiovascular:      Rate and Rhythm: Normal rate and regular rhythm.      Pulses:  Normal pulses.      Heart sounds: Normal heart sounds.   Pulmonary:      Effort: Pulmonary effort is normal.      Breath sounds: Normal breath sounds.   Chest:      Chest wall: Tenderness present.   Abdominal:      General: There is no distension.      Palpations: Abdomen is soft.      Tenderness: There is no abdominal tenderness. There is no guarding or rebound.   Musculoskeletal:         General: No swelling or tenderness. Normal range of motion.      Right lower leg: No edema.      Left lower leg: No edema.   Skin:     General: Skin is warm and dry.   Neurological:      Mental Status: He is alert and oriented to person, place, and time.   Psychiatric:         Behavior: Behavior normal.        Procedures     Procedures    No orders of the defined types were placed in this encounter.       Medications given during this emergency department visit:  Medications   dexAMETHasone (DECADRON) injection 10 mg (has no administration in time range)   ketorolac (TORADOL) injection 15 mg (has no administration in time range)       New Prescriptions    No medications on file        No past medical history on file.     No past  surgical history on file.     Social History     Socioeconomic History    Marital status: Single   Tobacco Use    Smoking status: Never    Smokeless tobacco: Current     Types: Chew   Substance and Sexual Activity    Drug use: Yes     Types: Marijuana (Weed)     Comment: "every once in awhile"        Previous Medications    METHYLPREDNISOLONE (MEDROL DOSEPACK) 4 MG TABLET    Take by mouth.        No results found for any visits on 06/17/22.      No orders to display                No results for input(s): "COVID19" in the last 72 hours.    Voice dictation software was used during the making of this note.  This software is not perfect and grammatical and other typographical errors may be present.  This note has not been completely proofread for errors.     Dione Plover, MD  06/17/22 1932       Dione Plover, MD  06/17/22 [phone]

## 2022-06-17 NOTE — Discharge Instructions (Addendum)
Continue prednisone starting tomorrow.  Alternate ibuprofen 400 to 600 mg with Tylenol 500 to 650 mg every 3-4 hours for pain.  Follow-up with the doctor provided in 1 week if not improving.  Call the number below for primary care establishment    We would love to help you get a primary care doctor for follow-up after your emergency department visit.    Please call [phone] between 7AM - 6PM Monday to Friday.  A care navigator will be able to assist you with setting up a doctor close to your home.

## 2022-06-17 NOTE — ED Notes (Signed)
Patient mobility status  with no difficulty. Provider aware     I have reviewed discharge instructions with the patient.  The patient verbalized understanding.    Patient left ED via Discharge Method: ambulatory to Home with Significant Other.    Opportunity for questions and clarification provided.     Patient given 0 scripts.           Shelly Rubenstein, RN  06/17/22 2007

## 2022-06-20 ENCOUNTER — Inpatient Hospital Stay
Admit: 2022-06-20 | Discharge: 2022-06-20 | Disposition: A | Discharge status: Home / Self Care | Attending: Emergency Medicine

## 2022-06-20 ENCOUNTER — Emergency Department: Admit: 2022-06-20

## 2022-06-20 DIAGNOSIS — R569 Unspecified convulsions: Secondary | ICD-10-CM

## 2022-06-20 LAB — EKG 12-LEAD
Atrial Rate: 83 {beats}/min
P Axis: 77 degrees
P-R Interval: 148 ms
Q-T Interval: 368 ms
QRS Duration: 96 ms
QTc Calculation (Bazett): 435 ms
R Axis: 80 degrees
T Axis: 44 degrees
Ventricular Rate: 84 {beats}/min

## 2022-06-20 LAB — COMPREHENSIVE METABOLIC PANEL
ALT: 23 U/L (ref 12–65)
AST: 21 U/L (ref 15–37)
Albumin/Globulin Ratio: 1.5 (ref 1.0–1.9)
Albumin: 4.1 g/dL (ref 3.5–5.0)
Alk Phosphatase: 50 U/L (ref 40–129)
Anion Gap: 11 mmol/L (ref 9–18)
BUN: 15 MG/DL (ref 6–23)
CO2: 24 mmol/L (ref 20–28)
Calcium: 9.5 MG/DL (ref 8.8–10.2)
Chloride: 107 mmol/L (ref 98–107)
Creatinine: 0.91 MG/DL (ref 0.80–1.30)
Est, Glom Filt Rate: >90 mL/min/{1.73_m2} (ref >60)
Globulin: 2.8 g/dL (ref 2.3–3.5)
Glucose: 90 mg/dL (ref 70–99)
Potassium: 3.6 mmol/L (ref 3.5–5.1)
Sodium: 142 mmol/L (ref 136–145)
Total Bilirubin: 0.5 MG/DL (ref 0.0–1.2)
Total Protein: 6.9 g/dL (ref 6.3–8.2)

## 2022-06-20 LAB — CBC WITH AUTO DIFFERENTIAL
Basophils %: 0 % (ref 0.0–2.0)
Basophils Absolute: 0 10*3/uL (ref 0.0–0.2)
Eosinophils %: 0 % — ABNORMAL LOW (ref 0.5–7.8)
Eosinophils Absolute: 0 10*3/uL (ref 0.0–0.8)
Hematocrit: 45.9 % (ref 41.1–50.3)
Hemoglobin: 14.1 g/dL (ref 13.6–17.2)
Immature Granulocytes %: 0 % (ref 0.0–5.0)
Immature Granulocytes Absolute: 0 10*3/uL (ref 0.0–0.5)
Lymphocytes %: 26 % (ref 13–44)
Lymphocytes Absolute: 2 10*3/uL (ref 0.5–4.6)
MCH: 29.7 PG (ref 26.1–32.9)
MCHC: 30.7 g/dL — ABNORMAL LOW (ref 31.4–35.0)
MCV: 96.6 FL (ref 82–102)
MPV: 10.4 FL (ref 9.4–12.3)
Monocytes %: 6 % (ref 4.0–12.0)
Monocytes Absolute: 0.5 10*3/uL (ref 0.1–1.3)
Neutrophils %: 67 % (ref 43–78)
Neutrophils Absolute: 5.2 10*3/uL (ref 1.7–8.2)
Platelets: 213 10*3/uL (ref 150–450)
RBC: 4.75 M/uL (ref 4.23–5.6)
RDW: 15.5 % — ABNORMAL HIGH (ref 11.9–14.6)
WBC: 7.8 10*3/uL (ref 4.3–11.1)
nRBC: 0 10*3/uL (ref 0.0–0.2)

## 2022-06-20 LAB — TROPONIN: Troponin T: <6 ng/L (ref 0–22)

## 2022-06-20 MED ORDER — HYDROXYZINE HCL 25 MG PO TABS
25 | ORAL_TABLET | Freq: Three times a day (TID) | ORAL | 0 refills | Status: AC | PRN
Start: 2022-06-20 — End: ?

## 2022-06-20 MED ORDER — LORAZEPAM 2 MG/ML IJ SOLN
2 | INTRAMUSCULAR | Status: AC
Start: 2022-06-20 — End: 2022-06-20
  Administered 2022-06-20: 17:00:00 2 via INTRAVENOUS

## 2022-06-20 MED ORDER — LEVETIRACETAM 500 MG/5ML IV SOLN
500 | Freq: Two times a day (BID) | INTRAVENOUS | Status: DC
Start: 2022-06-20 — End: 2022-06-20
  Administered 2022-06-20: 16:00:00 1500 mg via INTRAVENOUS

## 2022-06-20 MED ORDER — LORAZEPAM 2 MG/ML IJ SOLN
2 | Freq: Once | INTRAMUSCULAR | Status: AC
Start: 2022-06-20 — End: 2022-06-20

## 2022-06-20 MED ORDER — LEVETIRACETAM 1000 MG PO TABS
1000 | ORAL_TABLET | Freq: Two times a day (BID) | ORAL | 0 refills | Status: AC
Start: 2022-06-20 — End: ?

## 2022-06-20 MED FILL — LORAZEPAM 2 MG/ML IJ SOLN: 2 MG/ML | INTRAMUSCULAR | Qty: 1

## 2022-06-20 MED FILL — LEVETIRACETAM 500 MG/5ML IV SOLN: 500 MG/5ML | INTRAVENOUS | Qty: 15

## 2022-06-20 NOTE — Discharge Instructions (Addendum)
Take Keppra twice daily as prescribed.  Take Atarax as needed for anxiety.  Follow-up with neurology, a referral was placed for you.  Do not drive, go swimming, climb ladders or do any other risky activity until cleared by neurology.  Return to the ER for worsening or worrisome symptoms    We would love to help you get a primary care doctor for follow-up after your emergency department visit.    Please call [phone] between 7AM - 6PM Monday to Friday.  A care navigator will be able to assist you with setting up a doctor close to your home.

## 2022-06-20 NOTE — ED Notes (Signed)
Patient mobility status  with no difficulty. Provider aware     I have reviewed discharge instructions with the patient.  The patient verbalized understanding.    Patient left ED via Discharge Method: ambulatory to Home with Extended Family:.    Opportunity for questions and clarification provided.     Patient given 2 scripts.            Ella Bodo, RN  06/20/22 [phone]

## 2022-06-20 NOTE — ED Provider Notes (Signed)
Emergency Department Provider Note       PCP: No primary care provider on file.   Age: 22 y.o.   Sex: male     DISPOSITION Decision To Discharge 06/20/2022 02:20:50 PM       ICD-10-CM    1. Convulsions, unspecified convulsion type (HCC)  R56.9 [facility]          Medical Decision Making     22 year old male presents to the emergency department with fianc and mother-in-law.  Patient reports having seizures earlier this morning.  States he felt unwell complained of some chest discomfort which he presents to the ER the other day for.  States the pain became more severe so he sat on the bed.  Fianc reports he began to sway back-and-forth and then fell backwards on the bed and tightened up in his upper extremities.  States he had a few episodes of this with lack of tongue biting, loss of bowel or bladder control and fairly rapidly returning to baseline mental status.  EMS was called, given Versed in route.  Patient reports previously being on Keppra but took himself off approximate 1.5 years ago secondary to mood swings.  Patient denies following with a neurologist routinely.  Reports recently moving back to [place] approximately 7 months ago.  Denies headaches, fevers or chills.  Denies neck pain.  Denies numbness or weakness in his arms or legs.  Basic blood work ordered.  While in the emergency department was called to reassess patient as he was having seizure-like activity.  Patient was witnessed with tightening of upper extremities, in the process I extended his left upper extremity towards the ceiling and patient held his hand in this position during his "seizure-like activity ".  Patient's vital signs did not change despite blood pressure slightly increasing.  He never became hypoxic although he appeared to be tightening up the muscles of his torso and upper extremities.  At this time patient was given 2 mg IV Ativan.  A few moments later I was called back to the room again for  repeat of the same activity.  Again vital signs remained normal.  Patient had tightening of his upper extremities and would not respond, with sternal rub and calling patient his name he made eye contact blink and then his seizing activity discontinued.  Shortly after patient answered his name and stated "that was painful ".  Advised patient to quit shaking all over that do not believe he is having actual epileptic induced convulsions.  Again they rapidly resolved with administration of a sternal rub and calling the patient's name.  Will obtain head CT to rule out intracranial abnormality given his continued episodes of convulsions.   CT head was negative for any emergent finding.  Patient is lab work is unremarkable with normal troponin.  I did have extensive discussion with patient family in reference to the etiology of his convulsions.  Will empirically place on Keppra and will give hydroxyzine to take for anxiety.  Referral to neurology was made along with outpatient primary care physician follow-up.  Return precautions given.  They voiced understanding and agreement.                       1 or more chronic illnesses with a severe exacerbation or progression.  Prescription drug management performed.  Shared medical decision making was utilized in creating the patients health plan today.    I independently ordered and reviewed each  unique test.  I reviewed external records: ED visit note from an outside group.   The patients assessment required an independent historian: [place].  The reason they were needed is important historical information not provided by the patient.  I interpreted the CT Scan no large intracranial hemorrhage noted.  My Independent EKG Interpretation: sinus rhythm, no evidence of arrhythmia      ST Segments:Nonspecific ST segments - NO STEMI   Rate: 76            History     22 year old male presents to the emergency department with fianc and mother-in-law.  Patient reports having seizures  earlier this morning.  States he felt unwell complained of some chest discomfort which he presents to the ER the other day for.  States the pain became more severe so he sat on the bed.  Fianc reports he began to sway back-and-forth and then fell backwards on the bed and tightened up in his upper extremities.  States he had a few episodes of this with lack of tongue biting, loss of bowel or bladder control and fairly rapidly returning to baseline mental status.  EMS was called, given Versed in route.  Patient reports previously being on Keppra but took himself off approximate 1.5 years ago secondary to mood swings.  Patient denies following with a neurologist routinely.  Reports recently moving back to [place] approximately 7 months ago.  Denies headaches, fevers or chills.  Denies neck pain.  Denies numbness or weakness in his arms or legs.        ROS     Review of Systems     Physical Exam     Vitals signs and nursing note reviewed:  Vitals:    06/20/22 1346 06/20/22 1404 06/20/22 1417 06/20/22 1430   BP: (!) 135/96 (!) 142/103 134/72 116/75   Pulse: 82 81 71 61   Resp: 14 12 18 16    Temp:       TempSrc:       SpO2: 100% 99% 95% 96%   Weight:       Height:          Physical Exam  Vitals and nursing note reviewed.   Constitutional:       General: He is not in acute distress.     Appearance: Normal appearance.   HENT:      Head: Normocephalic.      Nose: Nose normal.      Mouth/Throat:      Mouth: Mucous membranes are moist.      Comments: No tongue lacerations noted  Eyes:      Extraocular Movements: Extraocular movements intact.      Pupils: Pupils are equal, round, and reactive to light.   Cardiovascular:      Rate and Rhythm: Normal rate and regular rhythm.      Pulses: Normal pulses.      Heart sounds: Normal heart sounds.   Pulmonary:      Effort: Pulmonary effort is normal. No respiratory distress.      Breath sounds: Normal breath sounds.   Abdominal:      General: Abdomen is flat.      Palpations:  Abdomen is soft.      Tenderness: There is no abdominal tenderness.   Musculoskeletal:         General: No swelling. Normal range of motion.      Cervical back: Normal range of motion. No rigidity.   Skin:  General: Skin is warm.      Capillary Refill: Capillary refill takes less than 2 seconds.      Findings: No rash.   Neurological:      General: No focal deficit present.      Mental Status: He is alert and oriented to person, place, and time.      Cranial Nerves: No cranial nerve deficit.      Sensory: No sensory deficit.      Motor: No weakness.   Psychiatric:         Mood and Affect: Mood normal.        Procedures     Procedures    Orders Placed This Encounter   Procedures    CT HEAD WO CONTRAST    CBC with Auto Differential    CMP    Troponin    [facility]    EKG 12 Lead        Medications given during this emergency department visit:  Medications   LORazepam (ATIVAN) injection 2 mg (2 mg IntraVENous Given 06/20/22 1315)       Discharge Medication List as of 06/20/2022  2:48 PM        START taking these medications    Details   levETIRAcetam (KEPPRA) 1000 MG tablet Take 1 tablet by mouth 2 times daily, Disp-60 tablet, R-0Print      hydrOXYzine HCl (ATARAX) 25 MG tablet Take 1 tablet by mouth every 8 hours as needed for Anxiety, Disp-15 tablet, R-0Print              No past medical history on file.     No past surgical history on file.     Social History     Socioeconomic History    Marital status: Single   Tobacco Use    Smoking status: Never    Smokeless tobacco: Current     Types: Chew   Substance and Sexual Activity    Drug use: Yes     Types: Marijuana (Weed)     Comment: "every once in awhile"        Discharge Medication List as of 06/20/2022  2:48 PM        CONTINUE these medications which have NOT CHANGED    Details   methylPREDNISolone (MEDROL DOSEPACK) 4 MG tablet Take by mouth., Disp-1 kit, R-0Print              Results for orders placed or performed during the hospital  encounter of 06/20/22   CT HEAD WO CONTRAST    Narrative    Head CT    INDICATION: Seizure    TECHNIQUE: Multiple 2D axial images obtained through the brain without  intravenous contrast.  Radiation dose reduction techniques were used for this  study:  All CT scans performed at this facility use one or all of the following:  Automated exposure control, adjustment of the mA and/or kVp according to  patient's size, iterative reconstruction.    COMPARISON: None    FINDINGS: No areas of abnormal attenuation are seen in the brain. There is no CT  evidence of acute hemorrhage or infarction. The ventricles are normal in size.  There are no extra-axial fluid collections. No masses are seen. The sinuses are  clear. There are no bony lesions.      Impression    No CT evidence of acute intracranial abnormality.   CBC with Auto Differential   Result Value Ref Range  WBC 7.8 4.3 - 11.1 K/uL    RBC 4.75 4.23 - 5.6 M/uL    Hemoglobin 14.1 13.6 - 17.2 g/dL    Hematocrit 16.1 09.6 - 50.3 %    MCV 96.6 82 - 102 FL    MCH 29.7 26.1 - 32.9 PG    MCHC 30.7 (L) 31.4 - 35.0 g/dL    RDW 04.5 (H) 40.9 - 14.6 %    Platelets 213 150 - 450 K/uL    MPV 10.4 9.4 - 12.3 FL    nRBC 0.00 0.0 - 0.2 K/uL    Differential Type AUTOMATED      Neutrophils % 67 43 - 78 %    Lymphocytes % 26 13 - 44 %    Monocytes % 6 4.0 - 12.0 %    Eosinophils % 0 (L) 0.5 - 7.8 %    Basophils % 0 0.0 - 2.0 %    Immature Granulocytes % 0 0.0 - 5.0 %    Neutrophils Absolute 5.2 1.7 - 8.2 K/UL    Lymphocytes Absolute 2.0 0.5 - 4.6 K/UL    Monocytes Absolute 0.5 0.1 - 1.3 K/UL    Eosinophils Absolute 0.0 0.0 - 0.8 K/UL    Basophils Absolute 0.0 0.0 - 0.2 K/UL    Immature Granulocytes Absolute 0.0 0.0 - 0.5 K/UL   CMP   Result Value Ref Range    Sodium 142 136 - 145 mmol/L    Potassium 3.6 3.5 - 5.1 mmol/L    Chloride 107 98 - 107 mmol/L    CO2 24 20 - 28 mmol/L    Anion Gap 11 9 - 18 mmol/L    Glucose 90 70 - 99 mg/dL    BUN 15 6 - 23 MG/DL    Creatinine 8.11 9.14 - 1.30  MG/DL    Est, Glom Filt Rate >90 >60 ml/min/1.90m2    Calcium 9.5 8.8 - 10.2 MG/DL    Total Bilirubin 0.5 0.0 - 1.2 MG/DL    ALT 23 12 - 65 U/L    AST 21 15 - 37 U/L    Alk Phosphatase 50 40 - 129 U/L    Total Protein 6.9 6.3 - 8.2 g/dL    Albumin 4.1 3.5 - 5.0 g/dL    Globulin 2.8 2.3 - 3.5 g/dL    Albumin/Globulin Ratio 1.5 1.0 - 1.9     Troponin   Result Value Ref Range    Troponin T <6.0 0 - 22 ng/L   EKG 12 Lead   Result Value Ref Range    Ventricular Rate 84 BPM    Atrial Rate 83 BPM    P-R Interval 148 ms    QRS Duration 96 ms    Q-T Interval 368 ms    QTc Calculation (Bazett) 435 ms    P Axis 77 degrees    R Axis 80 degrees    T Axis 44 degrees    Diagnosis       Sinus rhythm    Confirmed by BITTRICK  MD (UC), Karie Kirks ([id number]) on 06/20/2022 2:49:32 PM           CT HEAD WO CONTRAST   Final Result   No CT evidence of acute intracranial abnormality.                   No results for input(s): "COVID19" in the last 72 hours.    Voice dictation software was used during the making of this note.  This software is not perfect and grammatical and other typographical errors may be present.  This note has not been completely proofread for errors.     Lonya Johannesen L, DO  06/20/22 1704

## 2022-06-20 NOTE — ED Triage Notes (Addendum)
Pt had 3 seizures at home today.  Pt has hx of seizures but last one was 6 months ago.  Pt was recently pleuritic chest pain and was prescribed steroids.  Pt had 10 mg versed at 11:17 am today.  Pt has been stable since.  Pt had seizures in bed and did not fall or hit his head.  Pt stopped taking keppra because personality would change per pt.

## 2022-06-20 NOTE — ED Notes (Signed)
Pt had active seizure.  Dr Dagoberto Ligas in room.  Pt oxygen saturation 95.     Ella Bodo, RN  06/20/22 [phone]

## 2022-06-21 ENCOUNTER — Inpatient Hospital Stay
Admit: 2022-06-21 | Discharge: 2022-06-22 | Disposition: A | Discharge status: Home / Self Care | Attending: Emergency Medicine

## 2022-06-21 ENCOUNTER — Emergency Department: Admit: 2022-06-21

## 2022-06-21 DIAGNOSIS — S51812A Laceration without foreign body of left forearm, initial encounter: Secondary | ICD-10-CM

## 2022-06-21 DIAGNOSIS — R45851 Suicidal ideations: Secondary | ICD-10-CM

## 2022-06-21 LAB — CBC WITH AUTO DIFFERENTIAL
Basophils %: 0 % (ref 0.0–2.0)
Basophils Absolute: 0 10*3/uL (ref 0.0–0.2)
Eosinophils %: 0 % — ABNORMAL LOW (ref 0.5–7.8)
Eosinophils Absolute: 0 10*3/uL (ref 0.0–0.8)
Hematocrit: 45.8 % (ref 41.1–50.3)
Hemoglobin: 15.9 g/dL (ref 13.6–17.2)
Immature Granulocytes %: 0 % (ref 0.0–5.0)
Immature Granulocytes Absolute: 0 10*3/uL (ref 0.0–0.5)
Lymphocytes %: 18 % (ref 13–44)
Lymphocytes Absolute: 1.7 10*3/uL (ref 0.5–4.6)
MCH: 29 PG (ref 26.1–32.9)
MCHC: 34.7 g/dL (ref 31.4–35.0)
MCV: 83.6 FL (ref 82–102)
MPV: 10.7 FL (ref 9.4–12.3)
Monocytes %: 5 % (ref 4.0–12.0)
Monocytes Absolute: 0.5 10*3/uL (ref 0.1–1.3)
Neutrophils %: 76 % (ref 43–78)
Neutrophils Absolute: 7.4 10*3/uL (ref 1.7–8.2)
Platelets: 276 10*3/uL (ref 150–450)
RBC: 5.48 M/uL (ref 4.23–5.6)
RDW: 12.7 % (ref 11.9–14.6)
WBC: 9.7 10*3/uL (ref 4.3–11.1)
nRBC: 0 10*3/uL (ref 0.0–0.2)

## 2022-06-21 LAB — COMPREHENSIVE METABOLIC PANEL W/ REFLEX TO MG FOR LOW K
ALT: 33 U/L (ref 12–65)
AST: 27 U/L (ref 15–37)
Albumin/Globulin Ratio: 1.9 (ref 1.0–1.9)
Albumin: 5 g/dL (ref 3.5–5.0)
Alk Phosphatase: 62 U/L (ref 40–129)
Anion Gap: 13 mmol/L (ref 9–18)
BUN: 15 MG/DL (ref 6–23)
CO2: 26 mmol/L (ref 20–28)
Calcium: 9.9 MG/DL (ref 8.8–10.2)
Chloride: 105 mmol/L (ref 98–107)
Creatinine: 1.02 MG/DL (ref 0.80–1.30)
Est, Glom Filt Rate: >90 mL/min/{1.73_m2} (ref >60)
Globulin: 2.7 g/dL (ref 2.3–3.5)
Glucose: 108 mg/dL — ABNORMAL HIGH (ref 70–99)
Potassium: 3.5 mmol/L (ref 3.5–5.1)
Sodium: 143 mmol/L (ref 136–145)
Total Bilirubin: 0.6 MG/DL (ref 0.0–1.2)
Total Protein: 7.7 g/dL (ref 6.3–8.2)

## 2022-06-21 LAB — TROPONIN
Troponin T: 6 ng/L (ref 0–22)
Troponin T: <6 ng/L (ref 0–22)

## 2022-06-21 LAB — URINALYSIS
BACTERIA, URINE: NEGATIVE /hpf
Bilirubin, Urine: NEGATIVE
Blood, Urine: NEGATIVE
Glucose, Ur: NEGATIVE mg/dL
Leukocyte Esterase, Urine: NEGATIVE
Nitrite, Urine: NEGATIVE
Specific Gravity, UA: 1.026 — ABNORMAL HIGH (ref 1.001–1.023)
Urobilinogen, Urine: 1 EU/dL (ref 0.2–1.0)
pH, Urine: 6.5 (ref 5.0–9.0)

## 2022-06-21 LAB — EKG 12-LEAD
Atrial Rate: 104 {beats}/min
P Axis: 71 degrees
P-R Interval: 143 ms
Q-T Interval: 338 ms
QRS Duration: 97 ms
QTc Calculation (Bazett): 445 ms
R Axis: 64 degrees
T Axis: -3 degrees
Ventricular Rate: 104 {beats}/min

## 2022-06-21 LAB — ETHANOL: Ethanol Lvl: <11 MG/DL

## 2022-06-21 LAB — URINE DRUG SCREEN
Amphetamine, Urine: NEGATIVE
Barbiturates, Urine: NEGATIVE
Benzodiazepines, Urine: POSITIVE — AB
Cocaine, Urine: NEGATIVE
Methadone, Urine: NEGATIVE
Opiates, Urine: NEGATIVE
PCP, Urine: NEGATIVE
THC, TH-Cannabinol, Urine: POSITIVE — AB

## 2022-06-21 LAB — MAGNESIUM: Magnesium: 2 mg/dL (ref 1.8–2.4)

## 2022-06-21 LAB — ACETAMINOPHEN LEVEL: Acetaminophen Level: <5 ug/mL — ABNORMAL LOW (ref 10–30)

## 2022-06-21 MED ORDER — BUSPIRONE HCL 10 MG PO TABS
10 MG | Freq: Three times a day (TID) | ORAL | Status: DC
Start: 2022-06-21 — End: 2022-06-22
  Administered 2022-06-21 – 2022-06-22 (×3): 10 mg via ORAL

## 2022-06-21 MED FILL — BUSPIRONE HCL 10 MG PO TABS: 10 MG | ORAL | Qty: 1

## 2022-06-21 NOTE — Behavioral Health Treatment Team (Signed)
PSYCHIATRIC EVALUATION     Date of Service: 06/21/2022     Purpose:  Psychiatric Diagnostic Evaluation  Referral Source: Hoffert, Deeann Dowse, MD  History  From: patient and patient chart        Chief Complaint:  Suicidal       History of Present Illness:  Carl Carter is a 22 y.o. 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       06-21-2022 - PMHNP note - Psychiatric consult complete. Chart was reviewed and patient was seen. The patient admits to recent suicidal ideation and attempt via OD on ibuprofen and cutting wrists.  Pt endorses recent stressors - financial, loss of father.  Pt denies homicidal ideation, denies AVH, and does not present as acutely psychotic or RTIS. At this time, patient MEETS criteria for an inpatient psychiatric admission. Patient does not have appropriate follow-up in the community. At this time, it is recommended to Continue 1:1 safety companion.     Full psychiatric consult note to follow.       Plan:  - Buspar 10 mg po tid - to target anxiety - Monitor for dizziness, HA, nervousness, sedation, excitement, nausea, restlessness      Thank you for consult. Please do not hesitate to contact provider if there are additional questions regarding patient.    Mael Delap R. Lyn Hollingshead PMHNP-BC  06/21/2022  [facility]

## 2022-06-21 NOTE — Care Coordination-Inpatient (Signed)
Chart review complete, CM met with pt at bedside, pt found sitting up in bed, pt has been seen x5 visits in the past 5 weeks with c/o seizures or Chest pain, pt denies ETOH abuse but does admit to [facility] at times. Pt states he has been having SI thought off and on for a while but last night it got worse and attempted to cut lt wrist and took what he thinks was 12-14 motrin tabs but states after taking medications he vomited them up. Pt avoids eye contact with CM during conversations, pt encouraged that after today's visit he may need to get a PCP since he has been seen in the ED with such frequency, pt agrees, states he just started a  new job, pt is noted uninsured. Pt states he lives with his fiance in a duplex apartment and fiance's mother lives in the other side of the duplex. Pt states he was at one time seen by a therapist and placed on medications Buspar but does not take it as directed states only takes when he feels he needs to. Pt states some years ago he was dx with anxiety and depression but no other medications.  Demographics, insurance and PCP confirmed with pt.    CM staff will remain available to assist as needed with dc needs.       06/21/22 1419   Service Assessment   Patient Orientation Alert and Oriented   Cognition Alert   History Provided By Patient   Primary Caregiver Self   Accompanied By/Relationship none   Support Systems Spouse/Significant Other;Other (Comment)  (fiance Ladona Ridgel in waiting room)   Patient's Healthcare Decision Maker is: Armed forces operational officer Next of Kin   PCP Verified by CM Yes  (no current PCP at this time)   Prior Functional Level Independent in ADLs/IADLs   Current Functional Level Independent in ADLs/IADLs   Can patient return to prior living arrangement Yes   Ability to make needs known: Good   Family able to assist with home care needs: Yes   Would you like for me to discuss the discharge plan with any other family members/significant others, and if so, who? Yes   Nurse, learning disability None   CM/SW Referral Psychiatry   Social/Functional History   Lives With Significant other   Type of Home Apartment   Home Layout One level   Home Access Level entry   Bathroom Shower/Tub Tub/Shower unit   Administrator, sports None   Receives Help From Family   ADL Assistance Independent   Homemaking Assistance Independent   Ambulation Assistance Independent   Transfer Assistance Independent   Active Driver Yes   Occupation Part time employment   Discharge Planning   Type of Residence Other (Comment)  (unknown)   Living Arrangements Spouse/Significant Other   Current Services Prior To Admission None   Potential Assistance Needed N/A   DME Ordered? No   Potential Assistance Purchasing Medications Yes   Type of Home Care Services None   Patient expects to be discharged to: Unknown   One/Two Story Residence One story   History of falls? 0   Services At/After Discharge   Transition of Care Consult (CM Consult) Discharge Planning

## 2022-06-21 NOTE — ED Notes (Signed)
Patient in paper scrubs. Belongings behind garage door threshold. Unable to close garage door due to cardiac/respiratory monitoring. Sitter at bedside.      Barnett Abu, RN  06/21/22 1440

## 2022-06-21 NOTE — ED Provider Notes (Signed)
Emergency Department Provider Note       PCP: No primary care provider on file.   Age: 22 y.o.   Sex: male     DISPOSITION Behavioral Health Hold 06/21/2022 04:34:37 PM       ICD-10-CM    1. Suicidal ideation  R45.851       2. Medication overdose, intentional self-harm, initial encounter [facility])  T50.902A           Medical Decision Making     22 year old male presents to the emergency department family.  Patient reports worsening depression with suicidal ideation.  Denies prior inpatient behavioral hospital stay.  Patient reports increased life stressors contributing to his current circumstances.  He reports taking 12-16 ibuprofen in a self-harm attempt.  He reports within 15 minutes he did puke and vomit all of the pills up.  He reports that were not dissolved and absorbed.  There is also some superficial lacerations to his nondominant forearm, none of which require repair.  A broad-based workup was ordered which shows no emergent findings.  Patient seen by psychiatry nurse practitioner who recommended involuntary commitment for fever medicine placement.  Patient placed on IVC papers by me.  See case management note for final plan.  Patient voiced understanding and agreement.     1 or more acute illnesses that pose a threat to life or bodily function.       I independently ordered and reviewed each unique test.  I reviewed external records: ED visit note from an outside group.                   History     22 year old male presents to the emergency department family.  Patient reports worsening depression with suicidal ideation.  Denies prior inpatient behavioral hospital stay.  Patient reports increased life stressors contributing to his current circumstances.  He reports taking 12-16 ibuprofen in a self-harm attempt.  He reports within 15 minutes he did puke and vomit all of the pills up.  He reports that were not dissolved and absorbed.  There is also some superficial lacerations to his nondominant forearm, none  of which require repair.         ROS     Review of Systems     Physical Exam     Vitals signs and nursing note reviewed:  Vitals:    06/21/22 1515 06/21/22 1545 06/21/22 1600 06/21/22 1615   BP: (!) 156/100 (!) 138/105 (!) 147/104 (!) 152/107   Pulse: 100 95 90 89   Resp: 16 19 14 11    Temp:       TempSrc:       SpO2: 99% 99% 99% 99%      Physical Exam  Vitals and nursing note reviewed.   Constitutional:       General: He is not in acute distress.     Appearance: Normal appearance.   HENT:      Head: Normocephalic.      Nose: Nose normal.      Mouth/Throat:      Mouth: Mucous membranes are moist.   Eyes:      Extraocular Movements: Extraocular movements intact.   Cardiovascular:      Rate and Rhythm: Normal rate.   Pulmonary:      Effort: Pulmonary effort is normal. No respiratory distress.   Abdominal:      General: Abdomen is flat.   Musculoskeletal:         General: No swelling.  Normal range of motion.      Cervical back: Normal range of motion. No rigidity.   Skin:     General: Skin is warm.      Capillary Refill: Capillary refill takes less than 2 seconds.      Findings: No rash.      Comments: Superficial linear lacerations to left forearm, appear to be self-inflicted, superficial in nature, do not require repair.   Neurological:      General: No focal deficit present.      Mental Status: He is alert and oriented to person, place, and time.   Psychiatric:         Mood and Affect: Mood normal.        Procedures     Procedures    Orders Placed This Encounter   Procedures   . XR CHEST 1 VIEW   . CBC with Auto Differential   . Comprehensive Metabolic Panel w/ Reflex to MG   . Troponin now then q90 min for 2 occurances   . Ethanol   . Urine Drug Screen   . Urinalysis   . Troponin   . Magnesium   . ADULT DIET; Regular   . 1:1 Constant Observer   . Inpatient consult to Psychiatry   . EKG 12 Lead   . Suicide precautions        Medications given during this emergency department visit:  Medications   busPIRone (BUSPAR)  tablet 10 mg (10 mg Oral Given 06/21/22 1634)       New Prescriptions    No medications on file        History reviewed. No pertinent past medical history.     History reviewed. No pertinent surgical history.     Social History     Socioeconomic History   . Marital status: Single     Spouse name: None   . Number of children: None   . Years of education: None   . Highest education level: None   Tobacco Use   . Smoking status: Never   . Smokeless tobacco: Current     Types: Chew   Substance and Sexual Activity   . Drug use: Yes     Types: Marijuana Sheran Fava)     Comment: "every once in awhile"        Previous Medications    HYDROXYZINE HCL (ATARAX) 25 MG TABLET    Take 1 tablet by mouth every 8 hours as needed for Anxiety    LEVETIRACETAM (KEPPRA) 1000 MG TABLET    Take 1 tablet by mouth 2 times daily    METHYLPREDNISOLONE (MEDROL DOSEPACK) 4 MG TABLET    Take by mouth.        Results for orders placed or performed during the hospital encounter of 06/21/22   XR CHEST 1 VIEW    Narrative    Chest X-ray    INDICATION: Chest pain    COMPARISON: June 16, 2022 TECHNIQUE: AP/PA view of the chest was obtained.    FINDINGS: The lungs are clear. There are no infiltrates or effusions.  The heart  size is normal.  The bony thorax is intact.        Impression    No acute findings in the chest     CBC with Auto Differential   Result Value Ref Range    WBC 9.7 4.3 - 11.1 K/uL    RBC 5.48 4.23 - 5.6 M/uL    Hemoglobin 15.9 13.6 - 17.2  g/dL    Hematocrit 16.1 09.6 - 50.3 %    MCV 83.6 82 - 102 FL    MCH 29.0 26.1 - 32.9 PG    MCHC 34.7 31.4 - 35.0 g/dL    RDW 04.5 40.9 - 81.1 %    Platelets 276 150 - 450 K/uL    MPV 10.7 9.4 - 12.3 FL    nRBC 0.00 0.0 - 0.2 K/uL    Differential Type AUTOMATED      Neutrophils % 76 43 - 78 %    Lymphocytes % 18 13 - 44 %    Monocytes % 5 4.0 - 12.0 %    Eosinophils % 0 (L) 0.5 - 7.8 %    Basophils % 0 0.0 - 2.0 %    Immature Granulocytes % 0 0.0 - 5.0 %    Neutrophils Absolute 7.4 1.7 - 8.2 K/UL     Lymphocytes Absolute 1.7 0.5 - 4.6 K/UL    Monocytes Absolute 0.5 0.1 - 1.3 K/UL    Eosinophils Absolute 0.0 0.0 - 0.8 K/UL    Basophils Absolute 0.0 0.0 - 0.2 K/UL    Immature Granulocytes Absolute 0.0 0.0 - 0.5 K/UL   Comprehensive Metabolic Panel w/ Reflex to MG   Result Value Ref Range    Sodium 143 136 - 145 mmol/L    Potassium 3.5 3.5 - 5.1 mmol/L    Chloride 105 98 - 107 mmol/L    CO2 26 20 - 28 mmol/L    Anion Gap 13 9 - 18 mmol/L    Glucose 108 (H) 70 - 99 mg/dL    BUN 15 6 - 23 MG/DL    Creatinine 9.14 7.82 - 1.30 MG/DL    Est, Glom Filt Rate >90 >60 ml/min/1.75m2    Calcium 9.9 8.8 - 10.2 MG/DL    Total Bilirubin 0.6 0.0 - 1.2 MG/DL    ALT 33 12 - 65 U/L    AST 27 15 - 37 U/L    Alk Phosphatase 62 40 - 129 U/L    Total Protein 7.7 6.3 - 8.2 g/dL    Albumin 5.0 3.5 - 5.0 g/dL    Globulin 2.7 2.3 - 3.5 g/dL    Albumin/Globulin Ratio 1.9 1.0 - 1.9     Ethanol   Result Value Ref Range    Ethanol Lvl <11 MG/DL   Urine Drug Screen   Result Value Ref Range    PCP, Urine Negative NEG      Benzodiazepines, Urine Positive (A) NEG      Cocaine, Urine Negative NEG      Amphetamine, Urine Negative NEG      Methadone, Urine Negative NEG      THC, TH-Cannabinol, Urine Positive (A) NEG      Opiates, Urine Negative NEG      Barbiturates, Urine Negative NEG     Urinalysis   Result Value Ref Range    Color, UA YELLOW/STRAW      Appearance CLEAR      Specific Gravity, UA 1.026 (H) 1.001 - 1.023      pH, Urine 6.5 5.0 - 9.0      Protein, UA TRACE (A) NEG mg/dL    Glucose, Ur Negative mg/dL    Ketones, Urine TRACE (A) NEG mg/dL    Bilirubin, Urine Negative NEG      Blood, Urine Negative NEG      Urobilinogen, Urine 1.0 0.2 - 1.0 EU/dL    Nitrite, Urine Negative NEG  Leukocyte Esterase, Urine Negative NEG      WBC, UA 0-4 U4 /hpf    RBC, UA 0-5 U5 /hpf    Epithelial Cells UA 0-5 U5 /hpf    BACTERIA, URINE Negative NEG /hpf    Casts 0-2 U2 /lpf   Troponin   Result Value Ref Range    Troponin T 6.0 0 - 22 ng/L   Magnesium    Result Value Ref Range    Magnesium 2.0 1.8 - 2.4 mg/dL   EKG 12 Lead   Result Value Ref Range    Ventricular Rate 104 BPM    Atrial Rate 104 BPM    P-R Interval 143 ms    QRS Duration 97 ms    Q-T Interval 338 ms    QTc Calculation (Bazett) 445 ms    P Axis 71 degrees    R Axis 64 degrees    T Axis -3 degrees    Diagnosis       Sinus tachycardia  Borderline T abnormalities, inferior leads    Confirmed by GREEN, MD, DANIEL ([id number]) on 06/21/2022 3:59:38 PM           XR CHEST 1 VIEW   Final Result   No acute findings in the chest                      No results for input(s): "COVID19" in the last 72 hours.    Voice dictation software was used during the making of this note.  This software is not perfect and grammatical and other typographical errors may be present.  This note has not been completely proofread for errors.     Eliott Nine, DO  06/21/22 [phone]

## 2022-06-21 NOTE — ED Notes (Signed)
Patient lying in bed with eyes closed. Respirations even and unlabored. Safety measures in place. 1:1 sitter at bedside for patient safety.        Shanon Payor, RN  06/21/22 2352

## 2022-06-21 NOTE — ED Notes (Addendum)
TRANSFER - OUT REPORT:    Verbal report given to Aurther Loft RN on Jaqualyn Juday  being transferred to [facility] for routine progression of patient care   # [phone]    Report consisted of patient's Situation, Background, Assessment and   Recommendations(SBAR).     Information from the following report(s) Nurse Handoff Report was reviewed with the receiving nurse.    Kinder Fall Assessment:    Presents to emergency department  because of falls (Syncope, seizure, or loss of consciousness): No  Age > 70: No  Altered Mental Status, Intoxication with alcohol or substance confusion (Disorientation, impaired judgment, poor safety awaremess, or inability to follow instructions): No  Impaired Mobility: Ambulates or transfers with assistive devices or assistance; Unable to ambulate or transer.: No             Lines:       Opportunity for questions and clarification was provided.      Patient transported with:  Curt Bears, RN  06/21/22 1906       Barnett Abu, RN  06/21/22 1910

## 2022-06-21 NOTE — ED Notes (Signed)
Patient lying in bed with eyes closed. Respirations even and unlabored. Safety measures in place. 1:1 sitter at bedside for patient safety.        Shanon Payor, RN  06/21/22 2229

## 2022-06-21 NOTE — ED Notes (Signed)
Constant Observer Yes - Name: Crystal   Constant Observer Oriented yes   High risk patients are in line of sight at all times Yes   Excess equipment/medical supplies not necessary for the care of the patient removed Yes   All sharp or dangerous objects are removed from room: including but not limited to belts, pens & pencils, needles, medications, cosmetics, lighters, matches, nail files, watches, necklaces, glass objects, razors, razor blades, knives, aerosol sprays, drawstring pants, shoes, cords (telephone, call bells, etc.) cleaning wipes or other cleaning items, aluminum cans, not permanently attached wall dcor Yes   Telephone/cell phone removed as well as TV remote (batteries can be swallowed) Yes   Patient belongings removed and labeled at nurses station Yes   Excess linen is removed from room Yes   All plastic bags are removed from the room and replaced with paper trash bags Yes   Patient is in paper scrubs or appropriate gown and using hospital socks with rubber soles Yes   No metal, hard eating utensils or hard plates are on meal tray Yes   Remove all cleaning agents used by [employer] Yes   If Crucifix is hanging on a nail, remove Crucifix as well as the nail Yes       *If any question above is answered "No," documentation is required.        Barnett Abu, RN  06/21/22 1438

## 2022-06-21 NOTE — Consults (Incomplete)
PSYCHIATRIC EVALUATION    Date of Service: 06/21/2022    Purpose:  Psychiatric Diagnostic Evaluation  Referral Source: Hoffert, Deeann Dowse, MD  History  From: patient and patient chart      Chief Complaint:  Suicidal      06-21-2022 - PMHNP note -           History of Present Illness:  Carl Carter is a 22 y.o. male with a history significant for MDD, anxiety    Pt presented to the ED today, c/o:    Triage note - Pt arrived via EMS from home. Pt states he had seizure that last 90 seconds witnessed by fiance. Pt c/o CP for 1 week. Pt has been to ER 4-5 times over last week. HR 110, sat 98%, 156/84, bgl 104. Endorses SI. Cut lt wrist and took 10 ibuprofen.     CM note - Chart review complete, CM met with pt at bedside, pt found sitting up in bed, pt has been seen x5 visits in the past 5 weeks with c/o seizures or Chest pain, pt denies ETOH abuse but does admit to [facility] at times. Pt states he has been having SI thought off and on for a while but last night it got worse and attempted to cut lt wrist and took what he thinks was 12-14 motrin tabs but states after taking medications he vomited them up. Pt avoids eye contact with CM during conversations, pt encouraged that after today's visit he may need to get a PCP since he has been seen in the ED with such frequency, pt agrees, states he just started a  new job, pt is noted uninsured. Pt states he lives with his fiance in a duplex apartment and fiance's mother lives in the other side of the duplex. Pt states he was at one time seen by a therapist and placed on medications Buspar but does not take it as directed states only takes when he feels he needs to. Pt states some years ago he was dx with anxiety and depression but no other medications.  Demographics, insurance and PCP confirmed with pt.     CM staff will remain available to assist as needed with dc needs.    RN note - Patient in paper scrubs. Belongings behind garage door threshold. Unable to close garage door due to  cardiac/respiratory monitoring. Sitter at bedside.       Chart Review:    04-13-2020 - OV - HPI  Patient has made appointment primarily over his blood pressures that he saw recently. When asked why he felt he needed to take his blood pressure, he states that there were "a couple wrist cuffs being given way" so he took one. When he was checking, noted his systolic reads were routinely above 150.    Patient has had longstanding history of anxiety and known h/o chest discomfort and palpitations leading to multiple ER visits. Has been worked up by way of cardiac and unremarkable each time. At one point was treated for anxiety years ago and when I discussed this with him in detail at is initial visit as a new patient, he told me that his intention was never to go back on medication if that could be avoided. He does share with me now though he thinks he probably should go back on something.    He has been on several different medications for anxiety but he does not remember but only a couple (Zoloft Prozac as well as hydroxyzine). Diagnosis  had been major depression with anxiety. Symptoms seem to be inhibiting in that he is worried a lot. He tends to stay in generalized anxious state. Hypervigilant with bodily cues as history has panned out. Does have mother who takes " buspirone" and he would like to try that. He has never been on that.  No thoughts of hurting himself or others. Sleep not affected.    Other Visit Diagnoses   Feared condition not demonstrated     Spoke to the patient about blood pressure being normal here. I do think that he has allowed his anxiety to get the better of him and I think at this point he realizes it would be much better for him to go back on medication to address that.    With that, decided to trial buspirone. Starting at 5 mg t.i.d.. Discussed that this could be adjusted if need be. We are going to bring him back in 3-4 weeks and see how he is doing. Any complications however he is to let me  know.    Advised wrist cuff BP machines are known to be inaccurate.     Assessment/Plan   Problem List Items Addressed This Visit   Anxiety - Primary   Relevant Medications   busPIRone (BUSPAR) 5mg  tablet     05-24-2020 - OV - HPI  Patient coming in for follow-up after having started buspirone for anxiety. States this is doing much good for his anxious thoughts. However, mentions sadness and depressed. He has had history of depression that has been treated in the past. See previous note. Has tried two SSRIs in the past-neither were successful. Nothing linked to his current mood. No thoughts of hurting himself or others.    Current Outpatient Medications:    busPIRone (BUSPAR) 10mg  tablet, Take 1 tablet (10 mg total) by mouth 3 (three) times a day Indications: Anxiety Disorder., Disp: 90 tablet, Rfl: 0      venlafaxine (EFFEXOR) 75 MG tablet, Take 0.5 tablets (37.5 mg total) by mouth 2 (two) times a day for 7 days, THEN 1 tablet (75 mg total) 2 (two) times a day., Disp: 60 tablet, Rfl: 5     07-05-2020 - OV - 22 y/o male here to f/u from visit May 24, 2020 for depression and anxiety. Had been battling anxiety and felt buspar helped but started with depression sxs. H/o same in past and has been attempted by other providers on SSRIs which were unsuccessful.  Coming back today stating could not tolerate the Effexor and stopped. Restarted back Buspar which he had a few left.     Chief Complaint  Patient presents with   Depression  said when he started taking effexor and would give him hot flashes so stopped and went back to buspar which he said had a few left from rx given in march for 30 days    Anxiety - cont Buspar; refilled. Hesitant to continue with trying new meds at this time given his current GI complaints which may confound issue. No urgency given no HI/SI. Pt also hesitant himself.   Mentioned may want to see specialist/ psychiatrist and/or consider therapy going forward for some of his issues.     08-16-2020 - OV  - 22 year old here by himself to discuss recent events.  Describes recent seizure activity.  First episode noted in May of this year. Early morning, patient being taken to work. Family in the car. Describes being nauseated that morning. He attributed to his "acid reflux". Vomited  before getting into the car. Had not been ill the night before. Described when riding in the car feeling "Fuzzy feeling" come over body. Family witnessed what appeared to be seizure activity with convulsions. Lasted approximately 1 minute to 1-1/2 minutes. Patient describes being extremely tired and fatigued after the event. Family drove immediately to an urgent care facility in [place] rather than the ER for the patient. They were limited in their evaluation but ran electrolytes. They were concerned he could have had with vomiting and electrolyte imbalance which contributed. There was mention of his electrolytes being "low".  Given patient did not have any history of seizure activity and was not febrile on arrival nor any recent head injury to account for this, they recommended getting a CT scan which he states "I never heard back". Does appear to be an order for an outside radiology visit but no results in Care everywhere. I believe the patient just did not show and there was poor communication.  He did not follow through afterwards and put it behind him.  Approximately 3-4 days ago -felt again 'fuzzy feeling" while with his girlfriend; inside of 5 min, "saw black" and girlfriend states that he "zoned out and stared" - did not convulse. Was at the time just sitting on the couch. Only other comment she made was that his "eyes did not look right".    Does mention having extra stress at work. Does have history of anxiety but states this is not an issue at this time.    "Fuzzy feeling" precedes these events by 15 minutes    Only additional symptoms is "nerve pain through hands/ legs - and in back"; 1-2x/day  Lightning and feels like a  "burn" - "something pooring on me."     Only change that I can see in the records is that his visit in May comments that he had stopped BusPar given it was causing depression but had to go back on it given he was intolerable to an alternative given.     09-11-2020 - ED - This is a 22 year old male, with past medical history as listed below, including seizures, who presents to ED today for evaluation s/p seizure and fall with left forearm pain.    09-23-2020 - ED - 22 year old male presents after he had a syncopal episode while sitting on the toilet earlier today. The patient fell off of the toilet and hit his head against the sink. Minutes later he had a seizure. He has a known history seizures and is currently on Keppra. He reports compliance with his medication. He states he did hit his head and has neck pain. No other complaints are voiced at this time. He has not been seen by a neurologist. He has not been referred to a neurologist.     10-16-2020 - ED - 22 year old male history of seizures on Keppra presenting to emergency department after seizure this morning. Denies any trauma. Reportedly postictal by EMS. Back to baseline upon arrival to the ED. states that he has seizures approximately every couple of weeks to few months. He was recently seen by his neurologist and his dose of his Keppra was increased. He is supposed to take 1000 mg twice a day, but states he takes 500 mg the morning and 1000 mg at night because he wanted to increase his dose slowly. He does not have a plan as to when to start taking the prescribed dose of 1000 mg twice a day. Currently back to baseline. Denies  missing any medications. Denies any other symptoms or complaints.     01-17-2021 - ED - ON WAY TO WORK 1.5 HOURS AGO, PT STATES HE "ZONED OUT" WITH NOT REMEMBERING THINGS. PT STATES HE WAS NOT INCONTINENT OF URINE. COMPLIANT WITH TAKING KEPPRA.     LWBS (Left Without Being Seen)                                                 Psychiatric  Review Of Systems:  Sleep:   Appetite:   Current suicidal/homicidal ideations: {Blank single:19197::"Denies","Endorses ***"}  Current auditory/visual hallucinations: {Blank single:19197::"Denies","Endorses ***"}    Medications:  No current facility-administered medications for this encounter.    Current Outpatient Medications:     levETIRAcetam (KEPPRA) 1000 MG tablet, Take 1 tablet by mouth 2 times daily, Disp: 60 tablet, Rfl: 0    hydrOXYzine HCl (ATARAX) 25 MG tablet, Take 1 tablet by mouth every 8 hours as needed for Anxiety, Disp: 15 tablet, Rfl: 0    methylPREDNISolone (MEDROL DOSEPACK) 4 MG tablet, Take by mouth., Disp: 1 kit, Rfl: 0    Allergies:  No Known Allergies    Past Psychiatric History (over the past 6 months, unless otherwise specified):  Previous diagnoses/symptoms:   Self-injurious behavior/risky thoughts or behaviors (past suicidal ideation/attempt):   Violence/Risk to others (past homicidal ideation/attempt):   Current psychiatric provider:   Previous psychiatric medication trials:   Previous psychiatric hospitalizations:   Previous therapy:   Previous ECT:     Past Medical History:  History of head trauma:   History of seizures:   History of Surgeries or Hospitalizations:   Other Past Medical History:  Active Ambulatory Problems     Diagnosis Date Noted    No Active Ambulatory Problems     Resolved Ambulatory Problems     Diagnosis Date Noted    No Resolved Ambulatory Problems     No Additional Past Medical History         Substance Use History (over the past 12 months, unless otherwise specified):  Tobacco: {Blank single:19197::"Denies","Endorses ***"}  Caffeine: {Blank single:19197::"Denies","Endorses ***"}  Alcohol: {Blank single:19197::"Denies","Endorses ***"}  Marijuana: {Blank single:19197::"Denies","Endorses ***"}  Cocaine: {Denies/Endorses:19197::"Denies","Endorses ***"}  Opiates: {Blank single:19197::"Denies","Endorses ***"}  Benzodiazepine: {Blank single:19197::"Denies","Endorses  ***"}  Other illicit drug usage: {Blank single:19197::"Denies","Endorses ***"}    Legal consequences of substance/alcohol use: {Blank single:19197::"Yes","No","Unknown"}  History of substance/alcohol abuse treatment: {Blank single:19197::"Yes","No","Unknown"}  Readiness for substance/alcohol abuse treatment, if applicable: {Blank single:19197::"Yes","No","Not applicable"}    Past Family History:  Family history of mental health conditions:   Family history of suicide?     Social History:  Childhood:  Psychological trauma or Abuse:   Living Situation / Interest:  Education:   Marital/Committed relationship and parenting history:    Occupational History:    Legal History:   Spiritual History:     EVALUATION    Vitals:   Vitals:    06/21/22 1500   BP: (!) 129/98   Pulse: 95   Resp: 28   Temp:    SpO2: 99%       Labs:   Recent Results (from the past 24 hour(s))   Urinalysis    Collection Time: 06/21/22  2:16 PM   Result Value Ref Range    Color, UA YELLOW/STRAW      Appearance CLEAR      Specific Gravity, UA  1.026 (H) 1.001 - 1.023      pH, Urine 6.5 5.0 - 9.0      Protein, UA TRACE (A) NEG mg/dL    Glucose, Ur Negative mg/dL    Ketones, Urine TRACE (A) NEG mg/dL    Bilirubin, Urine Negative NEG      Blood, Urine Negative NEG      Urobilinogen, Urine 1.0 0.2 - 1.0 EU/dL    Nitrite, Urine Negative NEG      Leukocyte Esterase, Urine Negative NEG      WBC, UA 0-4 U4 /hpf    RBC, UA 0-5 U5 /hpf    Epithelial Cells UA 0-5 U5 /hpf    BACTERIA, URINE Negative NEG /hpf    Casts 0-2 U2 /lpf   CBC with Auto Differential    Collection Time: 06/21/22  2:29 PM   Result Value Ref Range    WBC 9.7 4.3 - 11.1 K/uL    RBC 5.48 4.23 - 5.6 M/uL    Hemoglobin 15.9 13.6 - 17.2 g/dL    Hematocrit 16.1 09.6 - 50.3 %    MCV 83.6 82 - 102 FL    MCH 29.0 26.1 - 32.9 PG    MCHC 34.7 31.4 - 35.0 g/dL    RDW 04.5 40.9 - 81.1 %    Platelets 276 150 - 450 K/uL    MPV 10.7 9.4 - 12.3 FL    nRBC 0.00 0.0 - 0.2 K/uL    Differential Type AUTOMATED       Neutrophils % 76 43 - 78 %    Lymphocytes % 18 13 - 44 %    Monocytes % 5 4.0 - 12.0 %    Eosinophils % 0 (L) 0.5 - 7.8 %    Basophils % 0 0.0 - 2.0 %    Immature Granulocytes % 0 0.0 - 5.0 %    Neutrophils Absolute 7.4 1.7 - 8.2 K/UL    Lymphocytes Absolute 1.7 0.5 - 4.6 K/UL    Monocytes Absolute 0.5 0.1 - 1.3 K/UL    Eosinophils Absolute 0.0 0.0 - 0.8 K/UL    Basophils Absolute 0.0 0.0 - 0.2 K/UL    Immature Granulocytes Absolute 0.0 0.0 - 0.5 K/UL       Medical Review Of Systems:  Psychological ROS: {ros psych:310680}   Psychological ROS: {ros psych:310680}       Mental Status Evaluation:  General Appearance {psych appearance:21236::"normal body habitus","appears stated age","hygiene appropriate"}  General Behavior   Psychomotor Activity {psych psychomotor:21237::"normoactive"}  Gait and Station   Speech   {speech psych:21238::"fluent","normal volume","normal tone","normal rate","normal prosody","normal amount"}  Mood  Affect      Thought Process  Thought Content/Perceptual Disturbances {psych thought content:21244::"denies suicidal/homicidal ideation, auditory/visual hallucinations, paranoia, delusions, grandiosity, increased goal directed activity, preoccupation, obsessions/compulsions and phobias"}  Cognition/Sensorium    Insight  {insight/judgement:31893}  Judgment {insight/judgement:31893}        ASSESSMENT  Psychiatric Diagnoses:          Plan:          Jamiah Recore R. Lyn Hollingshead PMHNP-BC  06/21/2022  [facility]

## 2022-06-21 NOTE — ED Notes (Signed)
Constant Observer Yes - Name: Carl Carter   Constant Observer Oriented yes   High risk patients are in line of sight at all times Yes   Excess equipment/medical supplies not necessary for the care of the patient removed Yes   All sharp or dangerous objects are removed from room: including but not limited to belts, pens & pencils, needles, medications, cosmetics, lighters, matches, nail files, watches, necklaces, glass objects, razors, razor blades, knives, aerosol sprays, drawstring pants, shoes, cords (telephone, call bells, etc.) cleaning wipes or other cleaning items, aluminum cans, not permanently attached wall dcor Yes   Telephone/cell phone removed as well as TV remote (batteries can be swallowed) Yes   Patient belongings removed and labeled at nurses station Yes   Excess linen is removed from room Yes   All plastic bags are removed from the room and replaced with paper trash bags Yes   Patient is in paper scrubs or appropriate gown and using hospital socks with rubber soles Yes   No metal, hard eating utensils or hard plates are on meal tray Yes   Remove all cleaning agents used by [employer] Yes   If Crucifix is hanging on a nail, remove Crucifix as well as the nail Yes       *If any question above is answered "No," documentation is required.        Shanon Payor, RN  06/21/22 [phone]

## 2022-06-21 NOTE — Care Coordination-Inpatient (Signed)
Pt seen and evaluated by  Gerome Apley.  And Stacy A. Psych NP's, pt to be placed on IVC papers, papers completed and to be completed by MD, Sec. Will have notarized and scanned in system to be faxed to facility.  Referrals sent to [facility] for review.

## 2022-06-21 NOTE — ED Triage Notes (Signed)
Pt arrived via EMS from home. Pt states he had seizure that last 90 seconds witnessed by fiance. Pt c/o CP for 1 week. Pt has been to ER 4-5 times over last week. HR 110, sat 98%, 156/84, bgl 104. Endorses SI. Cut lt wrist and took 10 ibuprofen.

## 2022-06-21 NOTE — ED Notes (Signed)
Patient lying in bed with eyes closed. Respirations even and unlabored. Safety measures in place. 1:1 sitter at bedside for patient safety.        Shanon Payor, RN  06/21/22 [phone]

## 2022-06-21 NOTE — ED Notes (Signed)
Patient lying in bed with eyes closed. Respirations even and unlabored. Safety measures in place. 1:1 sitter at bedside for patient safety.        Shanon Payor, RN  06/21/22 [phone]

## 2022-06-22 MED FILL — BUSPIRONE HCL 10 MG PO TABS: 10 MG | ORAL | Qty: 1

## 2022-06-22 NOTE — ED Notes (Signed)
TRANSFER - OUT REPORT:    Verbal report given to Aurther Loft RN on Carl Carter  being transferred to Unit 2 for routine progression of patient care       Report consisted of patient's Situation, Background, Assessment and   Recommendations(SBAR).     Information from the following report(s) ED Encounter Summary was reviewed with the receiving nurse.    Kinder Fall Assessment:    Presents to emergency department  because of falls (Syncope, seizure, or loss of consciousness): No  Age > 70: No  Altered Mental Status, Intoxication with alcohol or substance confusion (Disorientation, impaired judgment, poor safety awaremess, or inability to follow instructions): No  Impaired Mobility: Ambulates or transfers with assistive devices or assistance; Unable to ambulate or transer.: No             Lines:       Opportunity for questions and clarification was provided.      Patient transported with:  Police           Ella Bodo, RN  06/22/22 0730

## 2022-06-22 NOTE — ED Notes (Signed)
Patient mobility status  with no difficulty. Provider aware     I have reviewed discharge instructions with the patient.  The patient verbalized understanding.    Patient left ED via Discharge Method: ambulatory to Skilled nursing facility with police    Opportunity for questions and clarification provided.     Patient given 0 scripts.            Ella Bodo, RN  06/22/22 1015

## 2022-06-22 NOTE — ED Notes (Signed)
Patient lying in bed watching tv. Respirations even and unlabored. Safety measures in place. 1:1 sitter at bedside for patient safety.        Shanon Payor, RN  06/22/22 [phone]

## 2022-06-22 NOTE — ED Notes (Signed)
Patient lying in bed with eyes closed. Respirations even and unlabored. Safety measures in place. 1:1 sitter at bedside for patient safety.        Shanon Payor, RN  06/22/22 [phone]

## 2022-06-22 NOTE — ED Notes (Signed)
Constant Observer Yes - Name: Chloe   Constant Observer Oriented yes   High risk patients are in line of sight at all times Yes   Excess equipment/medical supplies not necessary for the care of the patient removed Yes   All sharp or dangerous objects are removed from room: including but not limited to belts, pens & pencils, needles, medications, cosmetics, lighters, matches, nail files, watches, necklaces, glass objects, razors, razor blades, knives, aerosol sprays, drawstring pants, shoes, cords (telephone, call bells, etc.) cleaning wipes or other cleaning items, aluminum cans, not permanently attached wall dcor Yes   Telephone/cell phone removed as well as TV remote (batteries can be swallowed) Yes   Patient belongings removed and labeled at nurses station Yes   Excess linen is removed from room Yes   All plastic bags are removed from the room and replaced with paper trash bags Yes   Patient is in paper scrubs or appropriate gown and using hospital socks with rubber soles Yes   No metal, hard eating utensils or hard plates are on meal tray Yes   Remove all cleaning agents used by [employer] Yes   If Crucifix is hanging on a nail, remove Crucifix as well as the nail Yes        Ella Bodo, RN  06/22/22 [phone]

## 2022-06-22 NOTE — ED Notes (Signed)
Dr. Lurlean Horns accepting MD at [facility], Teodoro Spray, RN  06/22/22 [phone]

## 2022-06-22 NOTE — ED Notes (Signed)
Pt refused breakfast tray.     Ella Bodo, RN  06/22/22 [phone]

## 2022-06-22 NOTE — ED Notes (Signed)
Patient lying in bed with eyes closed. Respirations even and unlabored. Safety measures in place. 1:1 sitter at bedside for patient safety.        Shanon Payor, RN  06/22/22 [phone]

## 2022-06-22 NOTE — ED Notes (Signed)
Patient lying in bed with eyes closed. Respirations even and unlabored. Safety measures in place. 1:1 sitter at bedside for patient safety.        Shanon Payor, RN  06/22/22 [phone]

## 2022-06-22 NOTE — ED Notes (Signed)
Patient lying in bed with eyes closed. Respirations even and unlabored. Safety measures in place. 1:1 sitter at bedside for patient safety.        Shanon Payor, RN  06/22/22 [phone]

## 2022-10-11 ENCOUNTER — Inpatient Hospital Stay: Admit: 2022-10-11 | Discharge: 2022-10-11 | Disposition: A | Discharge status: Home / Self Care

## 2022-10-11 DIAGNOSIS — Z5329 Procedure and treatment not carried out because of patient's decision for other reasons: Secondary | ICD-10-CM

## 2022-10-11 NOTE — ED Notes (Signed)
Pt states he feels better and wants to leave, had patient sign an AMA form, reassured patient he was welcome to come back to ER if condition changed.     Judi Saa, RN  10/11/22 [phone]

## 2022-10-11 NOTE — ED Triage Notes (Signed)
Pt brought in by [facility] from work with complaint of chest wall pain x 90 minutes. Worsens with deep inspiration. VSS with EMS.

## 2023-09-19 ENCOUNTER — Emergency Department: Admit: 2023-09-19

## 2023-09-19 ENCOUNTER — Inpatient Hospital Stay
Admit: 2023-09-19 | Discharge: 2023-09-19 | Disposition: A | Discharge status: Home / Self Care | Arrived: VH | Attending: Emergency Medicine

## 2023-09-19 DIAGNOSIS — S46912A Strain of unspecified muscle, fascia and tendon at shoulder and upper arm level, left arm, initial encounter: Principal | ICD-10-CM

## 2023-09-19 MED ORDER — ONDANSETRON 4 MG PO TBDP
4 | ORAL | Status: DC
Start: 2023-09-19 — End: 2023-09-19

## 2023-09-19 NOTE — Discharge Instructions (Signed)
 Over-the-counter ibuprofen 800 mg 3 times daily for 5 days then 400 mg every 6 hours as needed for pain.  Tylenol 500 to 650 mg every 6 hours for breakthrough pain.  Apply ice to the sore shoulder for 15 to 20 minutes every 4 hours while awake for 3 to 5 days then changed to heat with a heating pad for 3 to 15 minutes 3 times a day for several more days.  No lifting more than 15 pounds with left arm.  Follow-up with your primary care doctor or with [facility] orthopedics when called with appointment time.  Return if any new, worsening or concerning symptoms especially redness warmth or fevers

## 2023-09-19 NOTE — ED Notes (Signed)
 Patient mobility status ambulates with no difficulty.     I have reviewed discharge instructions with the patient.  The patient verbalized understanding.    Patient left ED via Discharge Method: ambulatory to Home with self.    Opportunity for questions and clarification provided.     Patient given 0 scripts.            Milas Baptist, RN  09/19/23 [phone]

## 2023-09-19 NOTE — ED Provider Notes (Signed)
 Emergency Department Provider Note       [facility] EMERGENCY DEPT   PCP: None, None   Age: 23 y.o.   Sex: male     DISPOSITION Decision To Discharge 09/19/2023 06:17:57 AM    ICD-10-CM    1. Strain of left shoulder, initial encounter  S46.912A [facility]          Medical Decision Making     Reassuring workup with negative chest x-ray and left shoulder films.  There is pain with range of motion of the shoulder and previous injury.  Suspect musculoskeletal pain.  RICE therapy recommended followed by PCP follow-up.     1 acute complicated illness or injury.  Over the counter drug management performed.  Shared medical decision making was utilized in creating the patients health plan today.  I independently ordered and reviewed each unique test.           I interpreted the X-rays chest x-ray shows no acute process within normal cardiomediastinal silhouette clear lungs without infiltrate effusion or pneumothorax.  Left shoulder x-rays show no fracture dislocation or bony abnormality.              History     Left shoulder pain onset yesterday morning and worse since that time and worse with movement.  Prior injury to the shoulder from a baseball injury many years ago.  Patient reports he has developed some nausea and vomiting today and feels short of breath when he raises his arm above his head.  He denies cough cold congestion or fever.  Patient denies specific injury    The history is provided by the patient.     Physical Exam     Vitals signs and nursing note reviewed:  Vitals:    09/19/23 0544   BP: 123/76   Pulse: 63   Resp: 16   Temp: 97.7 F (36.5 C)   TempSrc: Oral   SpO2: 100%   Weight: 102.1 kg (225 lb)   Height: 1.956 m (6' 5)      Physical Exam  Vitals and nursing note reviewed.   Constitutional:       Appearance: Normal appearance.   HENT:      Head: Normocephalic and atraumatic.      Nose: Nose normal.      Mouth/Throat:      Mouth: Mucous membranes are moist.   Eyes:       Conjunctiva/sclera: Conjunctivae normal.      Pupils: Pupils are equal, round, and reactive to light.   Cardiovascular:      Rate and Rhythm: Normal rate and regular rhythm.      Pulses: Normal pulses.      Heart sounds: Normal heart sounds.   Pulmonary:      Effort: Pulmonary effort is normal.      Breath sounds: Normal breath sounds.      Comments: No chest crepitance noted  Chest:      Chest wall: No tenderness.   Abdominal:      General: There is no distension.      Palpations: Abdomen is soft.      Tenderness: There is no abdominal tenderness. There is no guarding or rebound.   Musculoskeletal:         General: No swelling or tenderness.      Left shoulder: No swelling, deformity, effusion, tenderness, bony tenderness or crepitus. Decreased range of motion. Normal strength. Normal pulse.  Cervical back: Neck supple.      Right lower leg: No edema.      Left lower leg: No edema.   Lymphadenopathy:      Cervical: No cervical adenopathy.   Skin:     General: Skin is warm and dry.   Neurological:      Mental Status: He is alert and oriented to person, place, and time.   Psychiatric:         Behavior: Behavior normal.          Procedures     Procedures    Orders placed during this emergency department visit:     Orders Placed This Encounter   Procedures    XR SHOULDER LEFT (MIN 2 VIEWS)    XR CHEST (2 VW)    [facility]        Medications given during this emergency department visit:   Medications - No data to display    New prescriptions:     New Prescriptions    No medications on file        Past History and Complexity:     No past medical history on file.     No past surgical history on file.     Social History     Socioeconomic History    Marital status: Single   Tobacco Use    Smoking status: Never    Smokeless tobacco: Current     Types: Chew   Substance and Sexual Activity    Drug use: Yes     Types: Marijuana (Weed)     Comment: every once in [employer]     Social Drivers of  Health     Social Connections: Unknown (07/31/2019)    Received from [facility], [facility]    Social Connections     Frequency of Communication with Friends and Family: Not asked     Frequency of Social Gatherings with Friends and Family: Not asked   Intimate Partner Violence: Unknown (07/31/2019)    Received from [facility], [facility]    Intimate Partner Violence     Fear of Current or Ex-Partner: Not asked     Emotionally Abused: Not asked     Physically Abused: Not asked     Sexually Abused: Not asked   Housing Stability: Not At Risk (04/29/2020)    Received from [facility]    Housing Stability     Was there a time when you did not have a steady place to sleep: Unrecognized value     Worried that the place you are staying is making you sick: Unrecognized value        Previous Medications    HYDROXYZINE  HCL (ATARAX ) 25 MG TABLET    Take 1 tablet by mouth every 8 hours as needed for Anxiety    LEVETIRACETAM  (KEPPRA ) 1000 MG TABLET    Take 1 tablet by mouth 2 times daily    METHYLPREDNISOLONE  (MEDROL  DOSEPACK) 4 MG TABLET    Take by mouth.        Results from this emergency department visit:      Results for orders placed or performed during the hospital encounter of 09/19/23   XR SHOULDER LEFT (MIN 2 VIEWS)    Narrative    Left Shoulder    INDICATION: Pain, injury    COMPARISON:  None    TECHNIQUE: Three views of the left shoulder were obtained    FINDINGS: There  is no evidence of fracture or dislocation. No bony lesions are  seen in the proximal left humerus or adjacent scapula.      Impression    Negative left shoulder      Electronically signed by Michelina GORMAN Bold   XR CHEST (2 VW)    Narrative    Chest X-ray    INDICATION: sob    COMPARISON: 06/21/2022.    TECHNIQUE: PA and lateral views of the chest were obtained.    FINDINGS: The lungs are clear. There are no infiltrates or effusions.  The heart  size is normal.  The bony thorax is intact.        Impression    No acute findings in  the chest      Electronically signed by Michelina GORMAN Bold         XR SHOULDER LEFT (MIN 2 VIEWS)   Final Result   Negative left shoulder         Electronically signed by Michelina GORMAN Bold      XR CHEST (2 VW)   Final Result   No acute findings in the chest         Electronically signed by Michelina GORMAN Bold                   No results for input(s): COVID19 in the last 72 hours.     Voice dictation software was used during the making of this note.  This software is not perfect and grammatical and other typographical errors may be present.  This note has not been completely proofread for errors.     Laraine Franky PARAS, MD  09/19/23 [phone]

## 2023-09-19 NOTE — ED Notes (Signed)
 Called for patient in lobby for triage with no response.     Hortense Fraction, RN  09/19/23 [phone]

## 2023-09-19 NOTE — ED Triage Notes (Signed)
 Patient arrives ambulatory to triage. Reports left shoulder pain x2 days. Hx previous rotator cuff tear. Also endorses nausea and vomiting that began this morning.
